# Patient Record
Sex: Female | Born: 1965 | Race: Black or African American | Hispanic: No | Marital: Married | Smoking: Never smoker
Health system: Southern US, Community
[De-identification: ages and names within clinical notes are randomized; demographics above are authoritative.]

---

## 1998-09-21 ENCOUNTER — Other Ambulatory Visit: Admission: RE | Admit: 1998-09-21 | Discharge: 1998-09-21 | Payer: Self-pay | Admitting: Obstetrics and Gynecology

## 1998-12-03 ENCOUNTER — Encounter: Payer: Self-pay | Admitting: Obstetrics and Gynecology

## 1998-12-03 ENCOUNTER — Ambulatory Visit (HOSPITAL_COMMUNITY): Admission: RE | Admit: 1998-12-03 | Discharge: 1998-12-03 | Payer: Self-pay | Admitting: Obstetrics and Gynecology

## 1999-03-24 ENCOUNTER — Encounter (HOSPITAL_COMMUNITY): Admission: RE | Admit: 1999-03-24 | Discharge: 1999-04-11 | Payer: Self-pay | Admitting: Obstetrics & Gynecology

## 1999-04-10 ENCOUNTER — Inpatient Hospital Stay (HOSPITAL_COMMUNITY): Admission: AD | Admit: 1999-04-10 | Discharge: 1999-04-12 | Payer: Self-pay | Admitting: *Deleted

## 2000-11-15 ENCOUNTER — Other Ambulatory Visit: Admission: RE | Admit: 2000-11-15 | Discharge: 2000-11-15 | Payer: Self-pay | Admitting: Obstetrics & Gynecology

## 2001-03-27 ENCOUNTER — Encounter: Admission: RE | Admit: 2001-03-27 | Discharge: 2001-03-27 | Payer: Self-pay | Admitting: Obstetrics & Gynecology

## 2001-03-27 ENCOUNTER — Encounter: Payer: Self-pay | Admitting: Obstetrics & Gynecology

## 2001-04-06 ENCOUNTER — Inpatient Hospital Stay (HOSPITAL_COMMUNITY): Admission: AD | Admit: 2001-04-06 | Discharge: 2001-04-08 | Payer: Self-pay | Admitting: Obstetrics

## 2003-07-28 ENCOUNTER — Encounter: Admission: RE | Admit: 2003-07-28 | Discharge: 2003-07-28 | Payer: Self-pay | Admitting: Obstetrics and Gynecology

## 2004-08-25 ENCOUNTER — Ambulatory Visit (HOSPITAL_COMMUNITY): Admission: RE | Admit: 2004-08-25 | Discharge: 2004-08-25 | Payer: Self-pay | Admitting: Obstetrics and Gynecology

## 2004-12-19 ENCOUNTER — Inpatient Hospital Stay (HOSPITAL_COMMUNITY): Admission: AD | Admit: 2004-12-19 | Discharge: 2004-12-19 | Payer: Self-pay | Admitting: Obstetrics & Gynecology

## 2004-12-19 ENCOUNTER — Ambulatory Visit: Payer: Self-pay | Admitting: Family Medicine

## 2004-12-23 ENCOUNTER — Inpatient Hospital Stay (HOSPITAL_COMMUNITY): Admission: AD | Admit: 2004-12-23 | Discharge: 2004-12-24 | Payer: Self-pay | Admitting: Obstetrics & Gynecology

## 2004-12-25 ENCOUNTER — Inpatient Hospital Stay (HOSPITAL_COMMUNITY): Admission: AD | Admit: 2004-12-25 | Discharge: 2004-12-27 | Payer: Self-pay | Admitting: Obstetrics and Gynecology

## 2006-08-04 IMAGING — US US FETAL BPP W/O NONSTRESS
1 series · 12 of 12 positions shown · non-contrast
Comparison: none

CLINICAL DATA: Patient is 40 weeks by office ultrasound.

[Series 1: us fetal bpp w/o nonstress · 0.35mm/px · 12 of 12 slices shown]
[im 1/12]
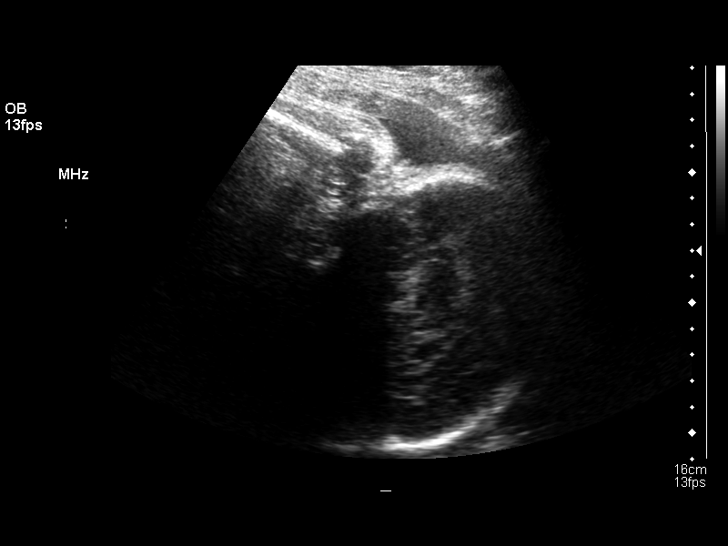
[im 2/12]
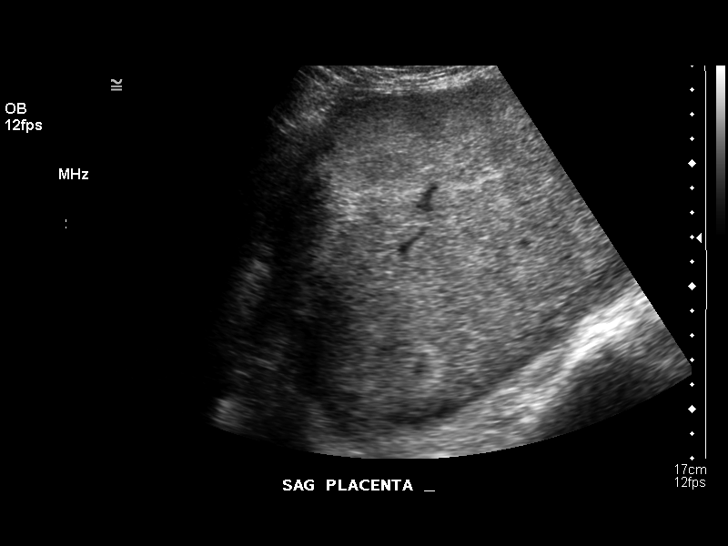
[im 3/12]
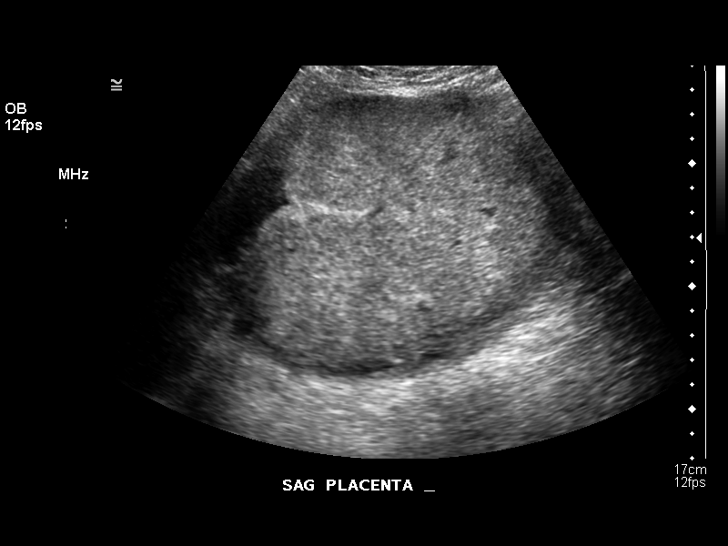
[im 4/12]
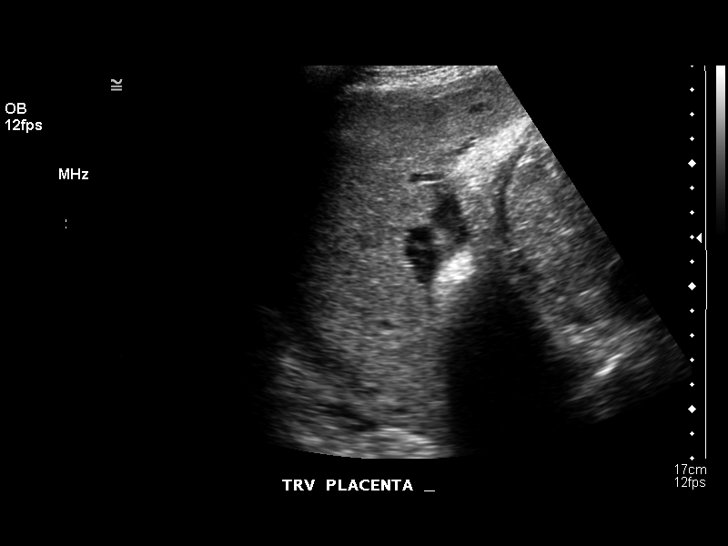
[im 5/12]
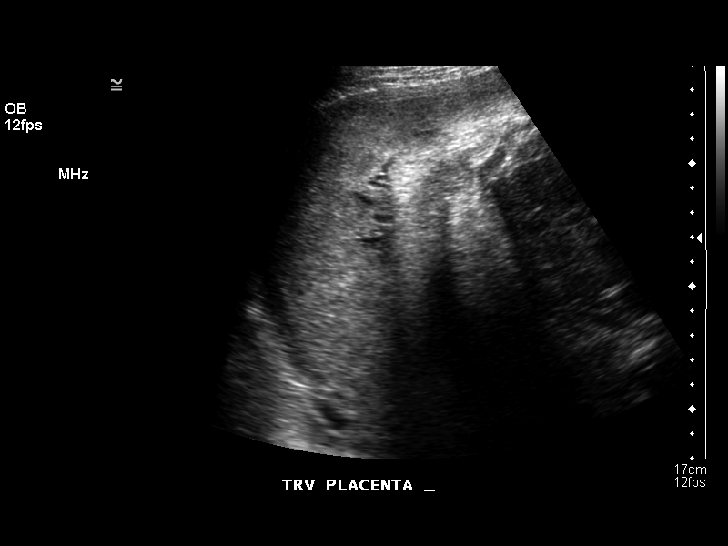
[im 6/12]
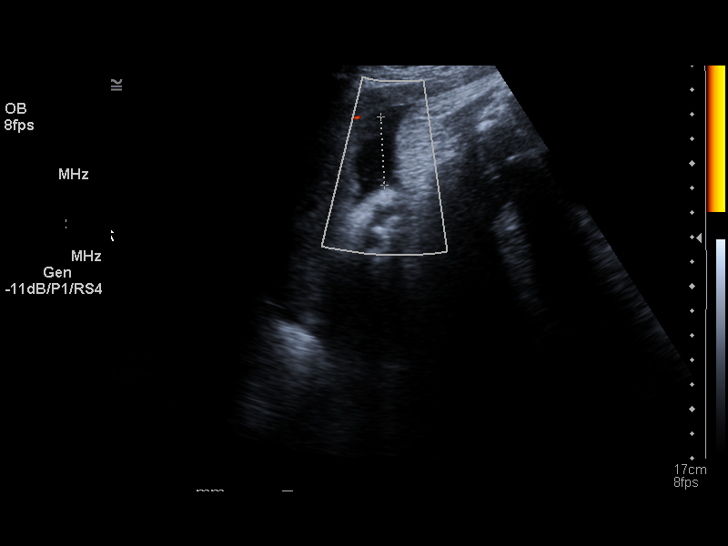
[im 7/12]
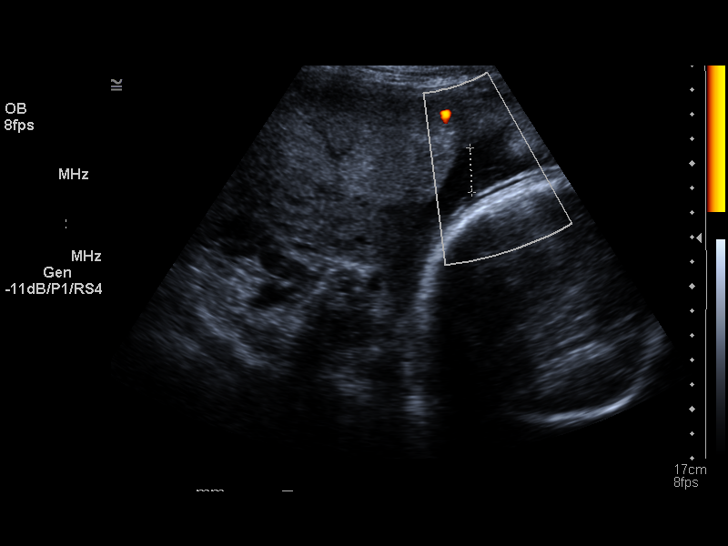
[im 8/12]
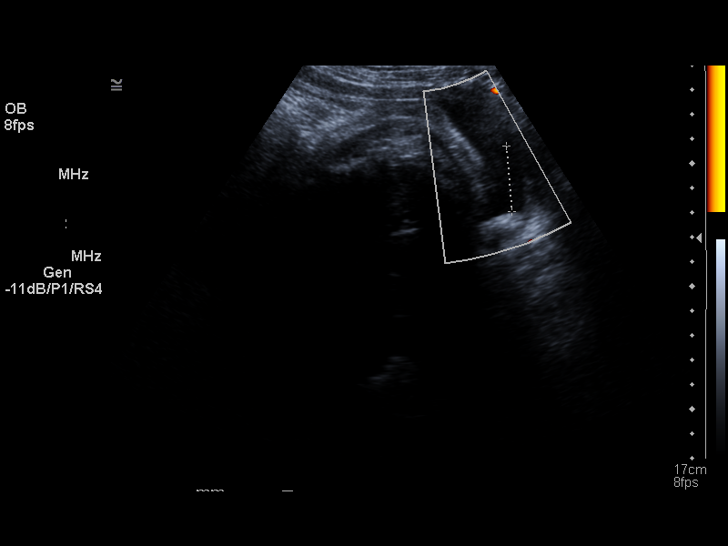
[im 9/12]
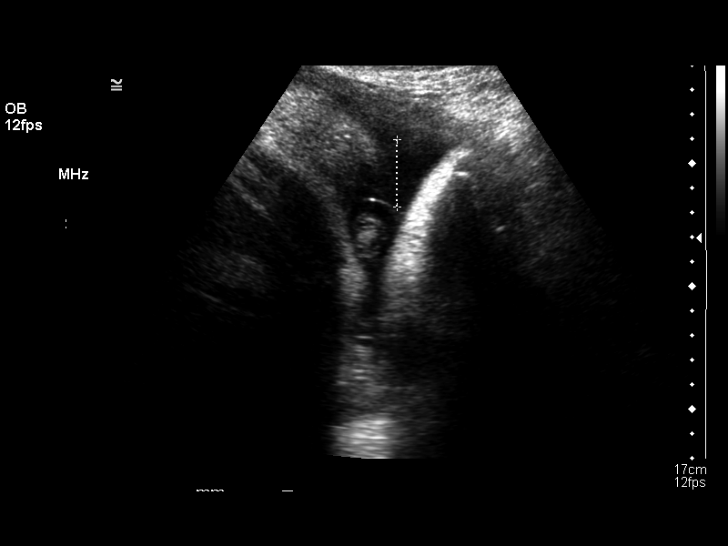
[im 10/12]
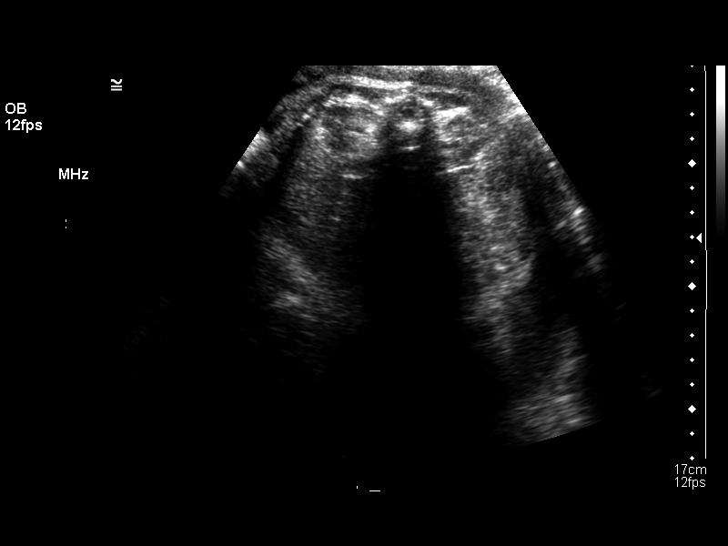
[im 11/12]
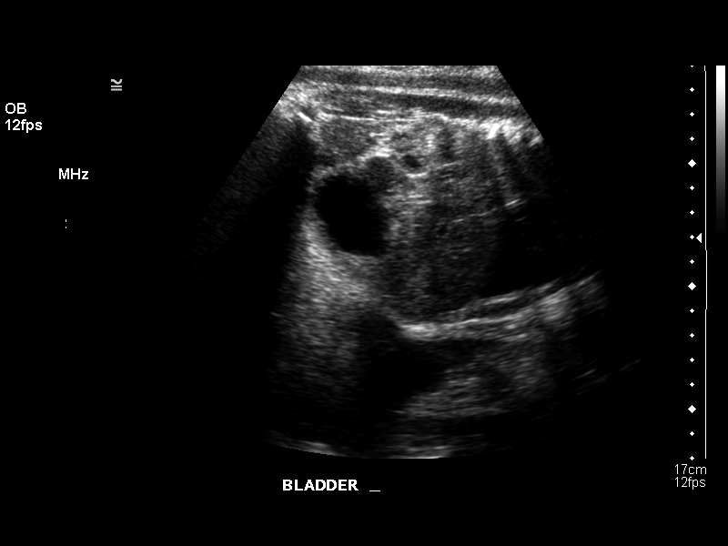
[im 12/12]
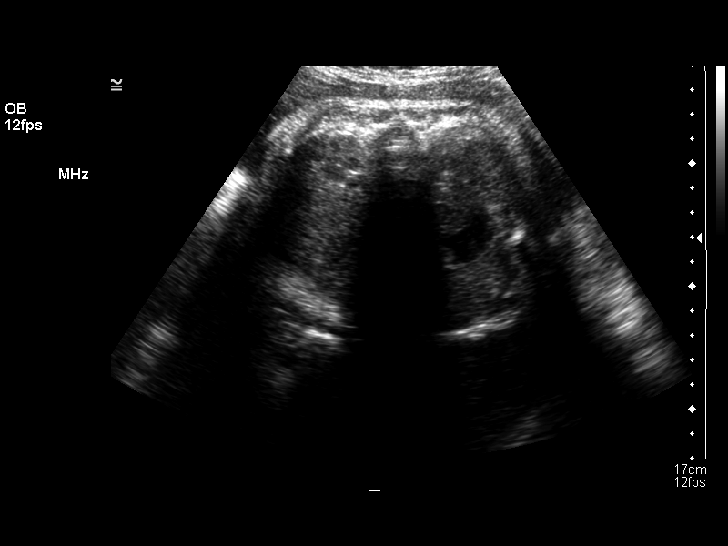

[12 of 12 positions shown; findings below may reference images not displayed]

BIOPHYSICAL PROFILE

Number of Fetuses:  Single
Heart rate:  143 beats per minute 
Movement:  Yes
Breathing:  Yes
Presentation:  Cephalic
Placental Location:  Anterior, right lateral
Grade:  II
Previa:  No
Amniotic Fluid (Subjective):  Normal
Amniotic Fluid (Objective):  AFI 10.0 cm (1th-31th %ile = 7.0-19.4 cm for 41 weeks) 

Fetal measurements and complete anatomic evaluation were not requested.  The following fetal anatomy was visualized on this exam:

BPP SCORING
Movements:  2
Breathing:  2
Tone:  2
Amniotic Fluid:  2
Total Score:  [DATE]

MATERNAL UTERINE AND ADNEXAL FINDINGS
Cervix:  Not evaluated
IMPRESSION: 1,  Single live intrauterine gestation with fetal cardiac motion at 143 beats per minute.   Biophysical profile remains [DATE] during 15 minutes of imaging.  

2.  Amniotic fluid volume remains subjectively normal with an AFI of 10.0 cm.

## 2007-01-07 ENCOUNTER — Ambulatory Visit (HOSPITAL_COMMUNITY): Admission: RE | Admit: 2007-01-07 | Discharge: 2007-01-07 | Payer: Self-pay | Admitting: Obstetrics & Gynecology

## 2007-04-24 ENCOUNTER — Ambulatory Visit: Payer: Self-pay | Admitting: Obstetrics & Gynecology

## 2007-04-24 ENCOUNTER — Inpatient Hospital Stay (HOSPITAL_COMMUNITY): Admission: AD | Admit: 2007-04-24 | Discharge: 2007-04-25 | Payer: Self-pay | Admitting: Family Medicine

## 2008-08-17 IMAGING — US US OB DETAIL+14 WK
1 series · 14 of 18 positions shown · non-contrast
Comparison: none

OBSTETRICAL ULTRASOUND:

 This ultrasound exam was performed in the [HOSPITAL] Ultrasound Department.  The OB US report was generated in the AS system, and faxed to the ordering physician.  This report is also available in [REDACTED] PACS.

[Series 1: us ob detail+14 wk · 0.24mm/px · 14 of 18 slices shown]
[im 1/18]
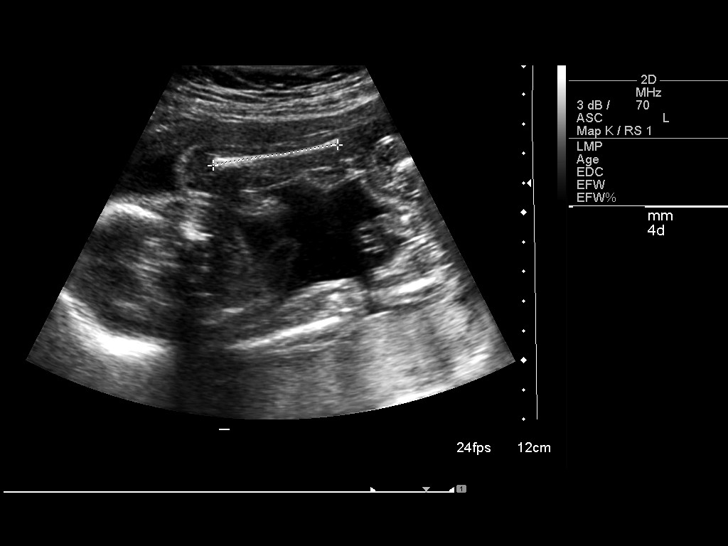
[im 2/18]
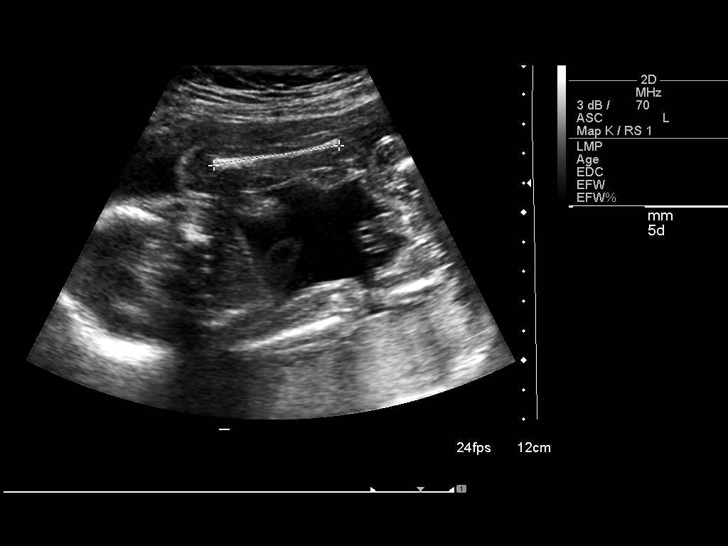
[im 4/18]
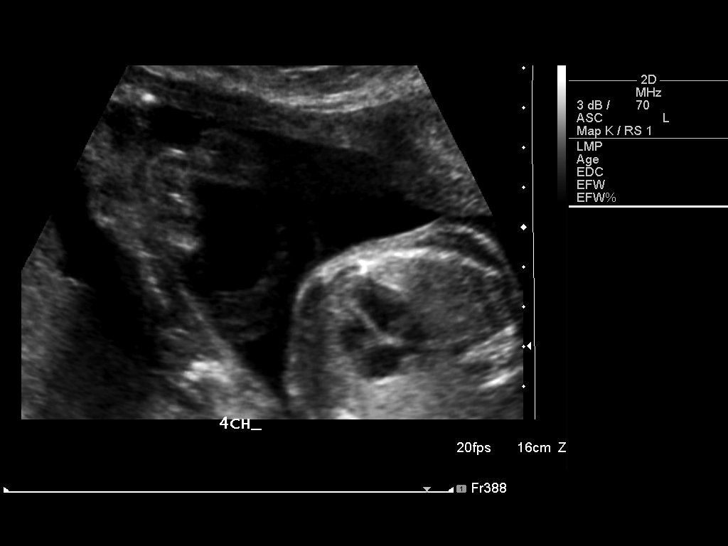
[im 5/18]
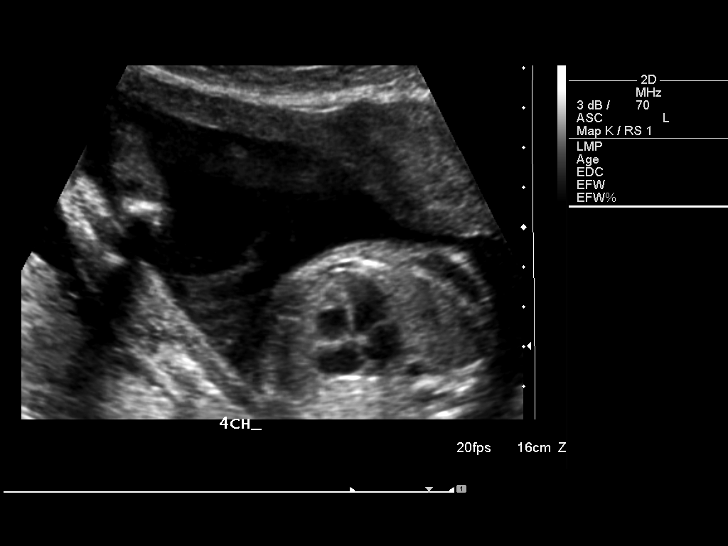
[im 6/18]
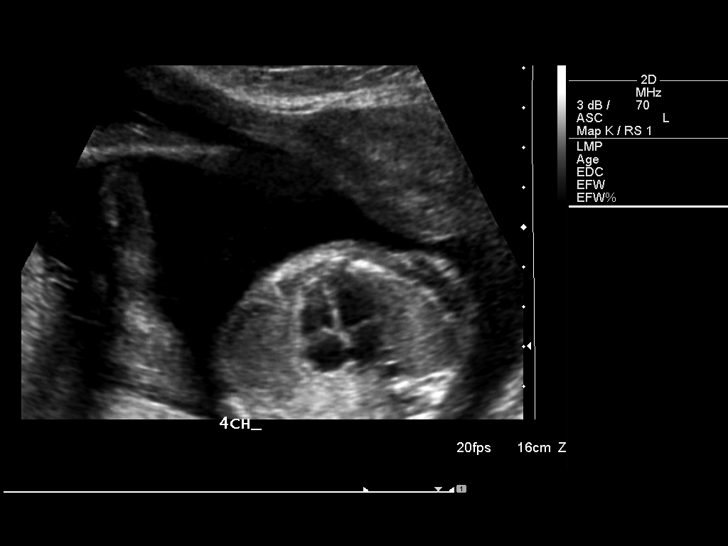
[im 8/18]
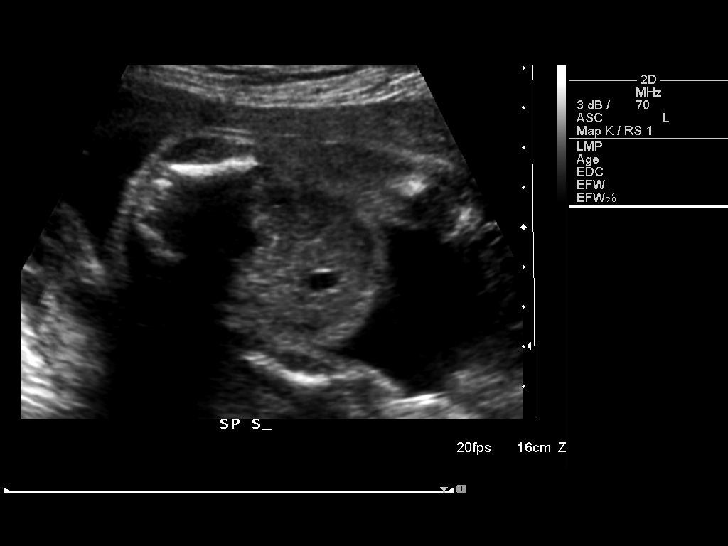
[im 9/18]
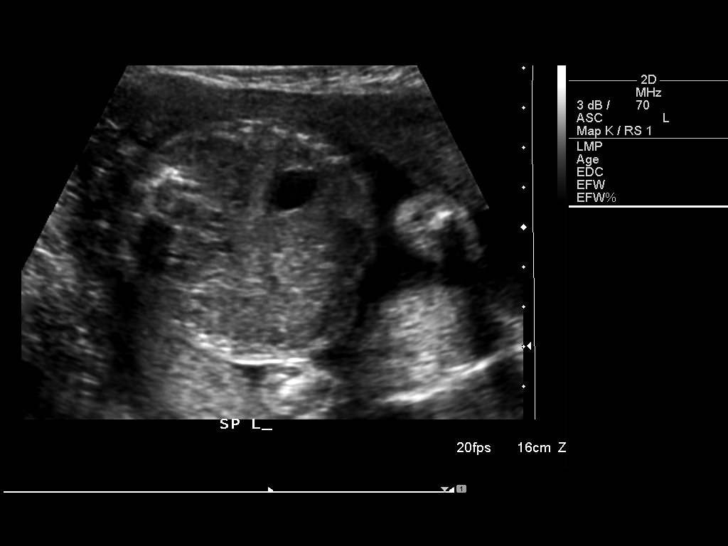
[im 10/18]
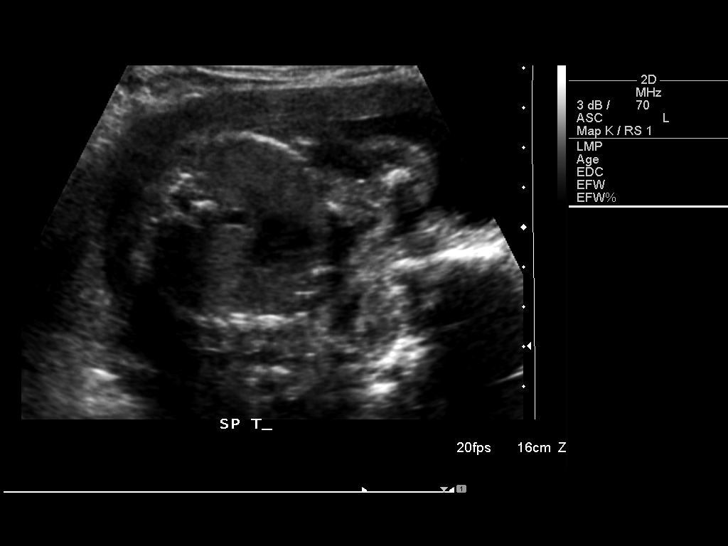
[im 11/18]
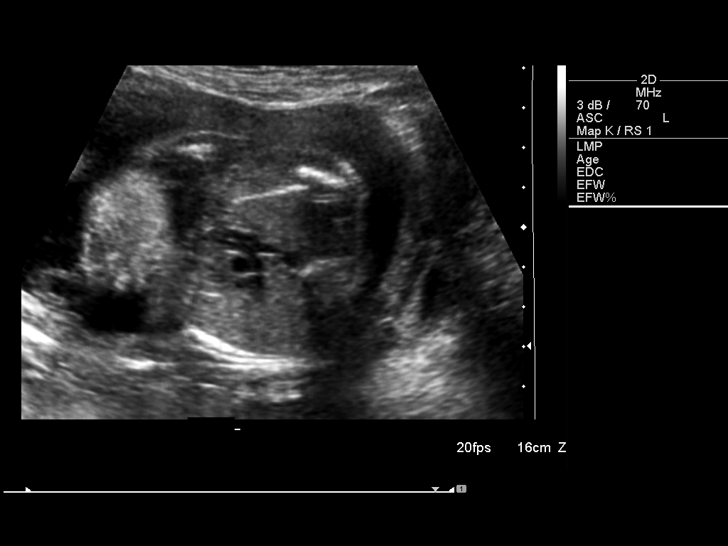
[im 13/18]
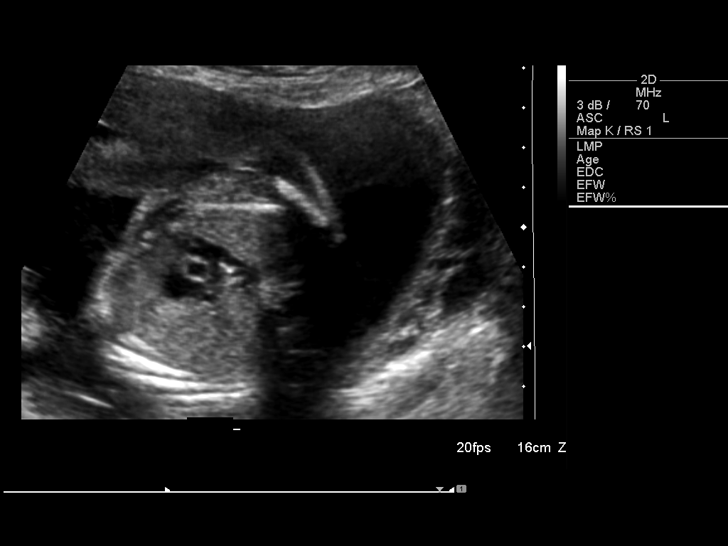
[im 14/18]
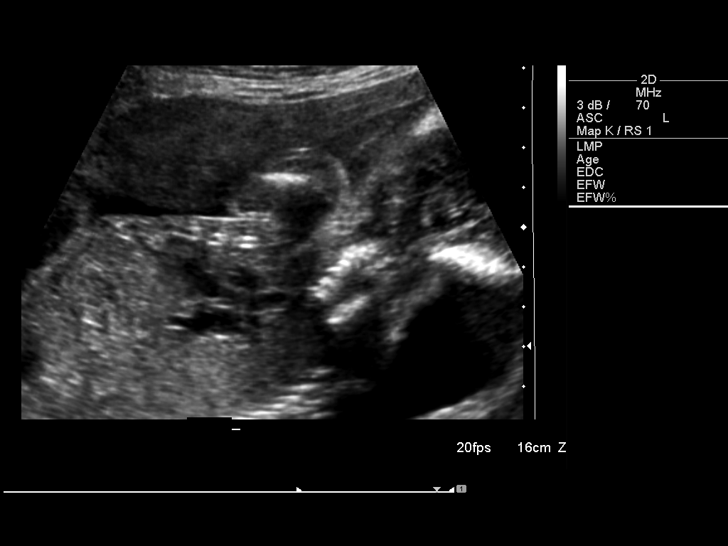
[im 15/18]
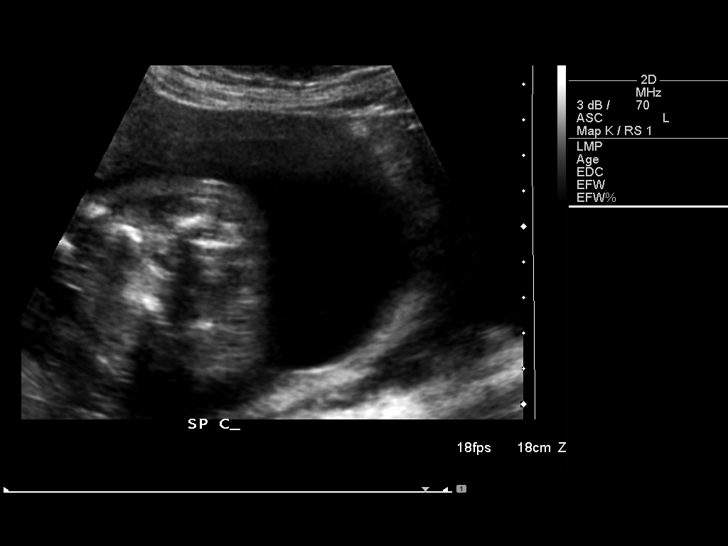
[im 17/18]
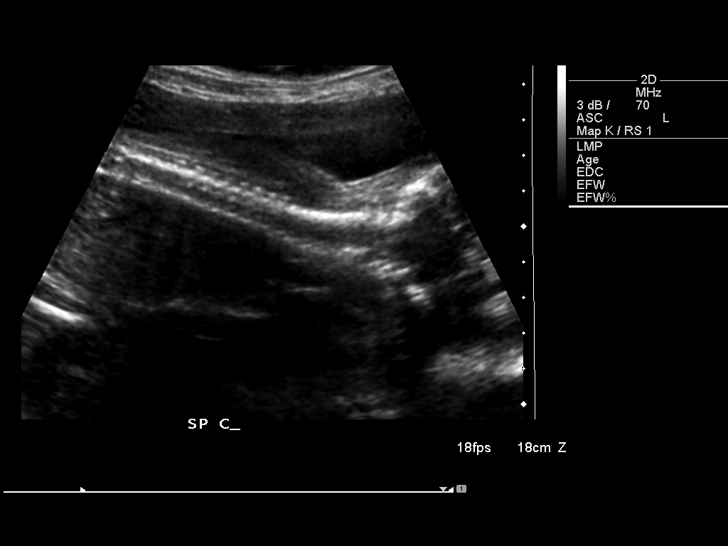
[im 18/18]
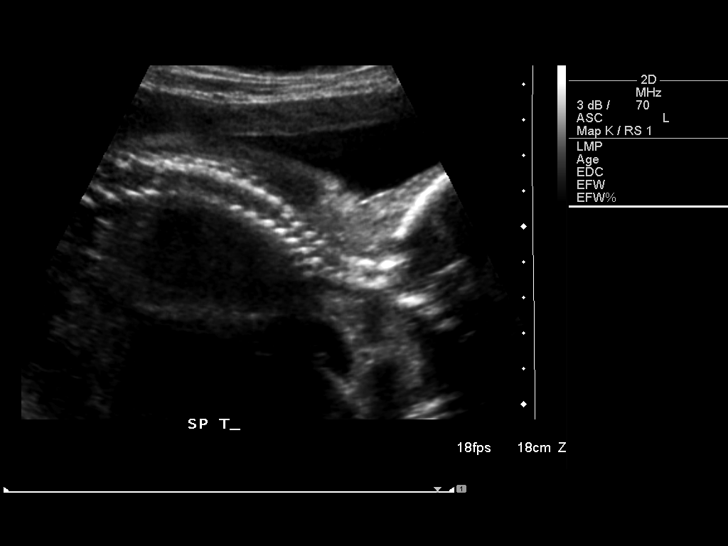

[14 of 18 positions shown; findings below may reference images not displayed]

IMPRESSION: See AS Obstetric US report.

## 2011-06-19 ENCOUNTER — Other Ambulatory Visit: Payer: Self-pay | Admitting: Internal Medicine

## 2011-06-19 ENCOUNTER — Ambulatory Visit
Admission: RE | Admit: 2011-06-19 | Discharge: 2011-06-19 | Disposition: A | Discharge status: Home / Self Care | Payer: No Typology Code available for payment source | Source: Ambulatory Visit | Attending: Internal Medicine | Admitting: Internal Medicine

## 2011-06-19 DIAGNOSIS — R7611 Nonspecific reaction to tuberculin skin test without active tuberculosis: Secondary | ICD-10-CM

## 2011-07-10 LAB — CBC
HCT: 34.5 — ABNORMAL LOW
Hemoglobin: 11.8 — ABNORMAL LOW
MCV: 86.9
Platelets: 270
RDW: 13.9

## 2013-01-27 IMAGING — CR DG CHEST 2V
2 series · 2 of 2 positions shown · non-contrast
Comparison: None.

CLINICAL DATA: Positive PPD.

CHEST - 2 VIEW

[w chest pa]
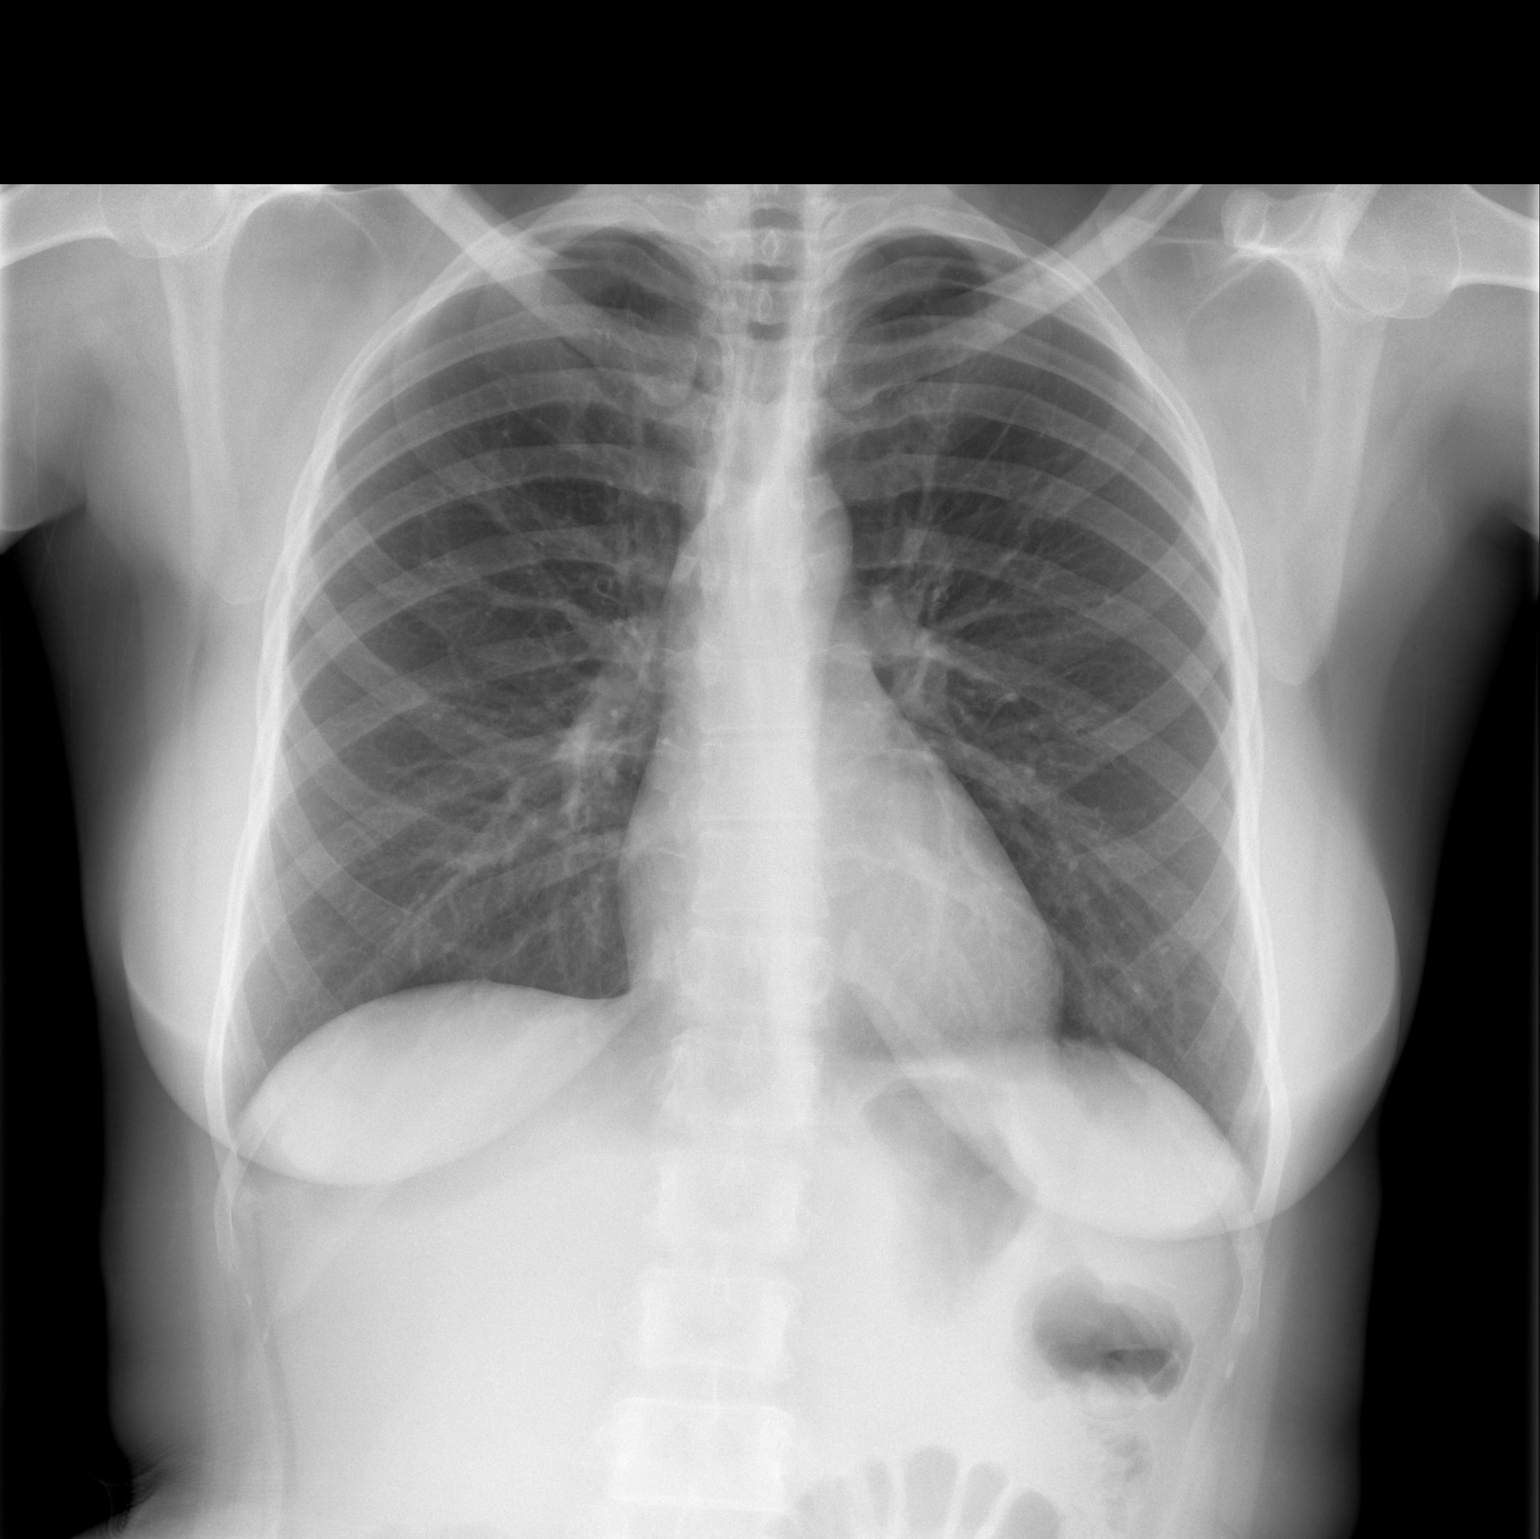

[w chest lat]
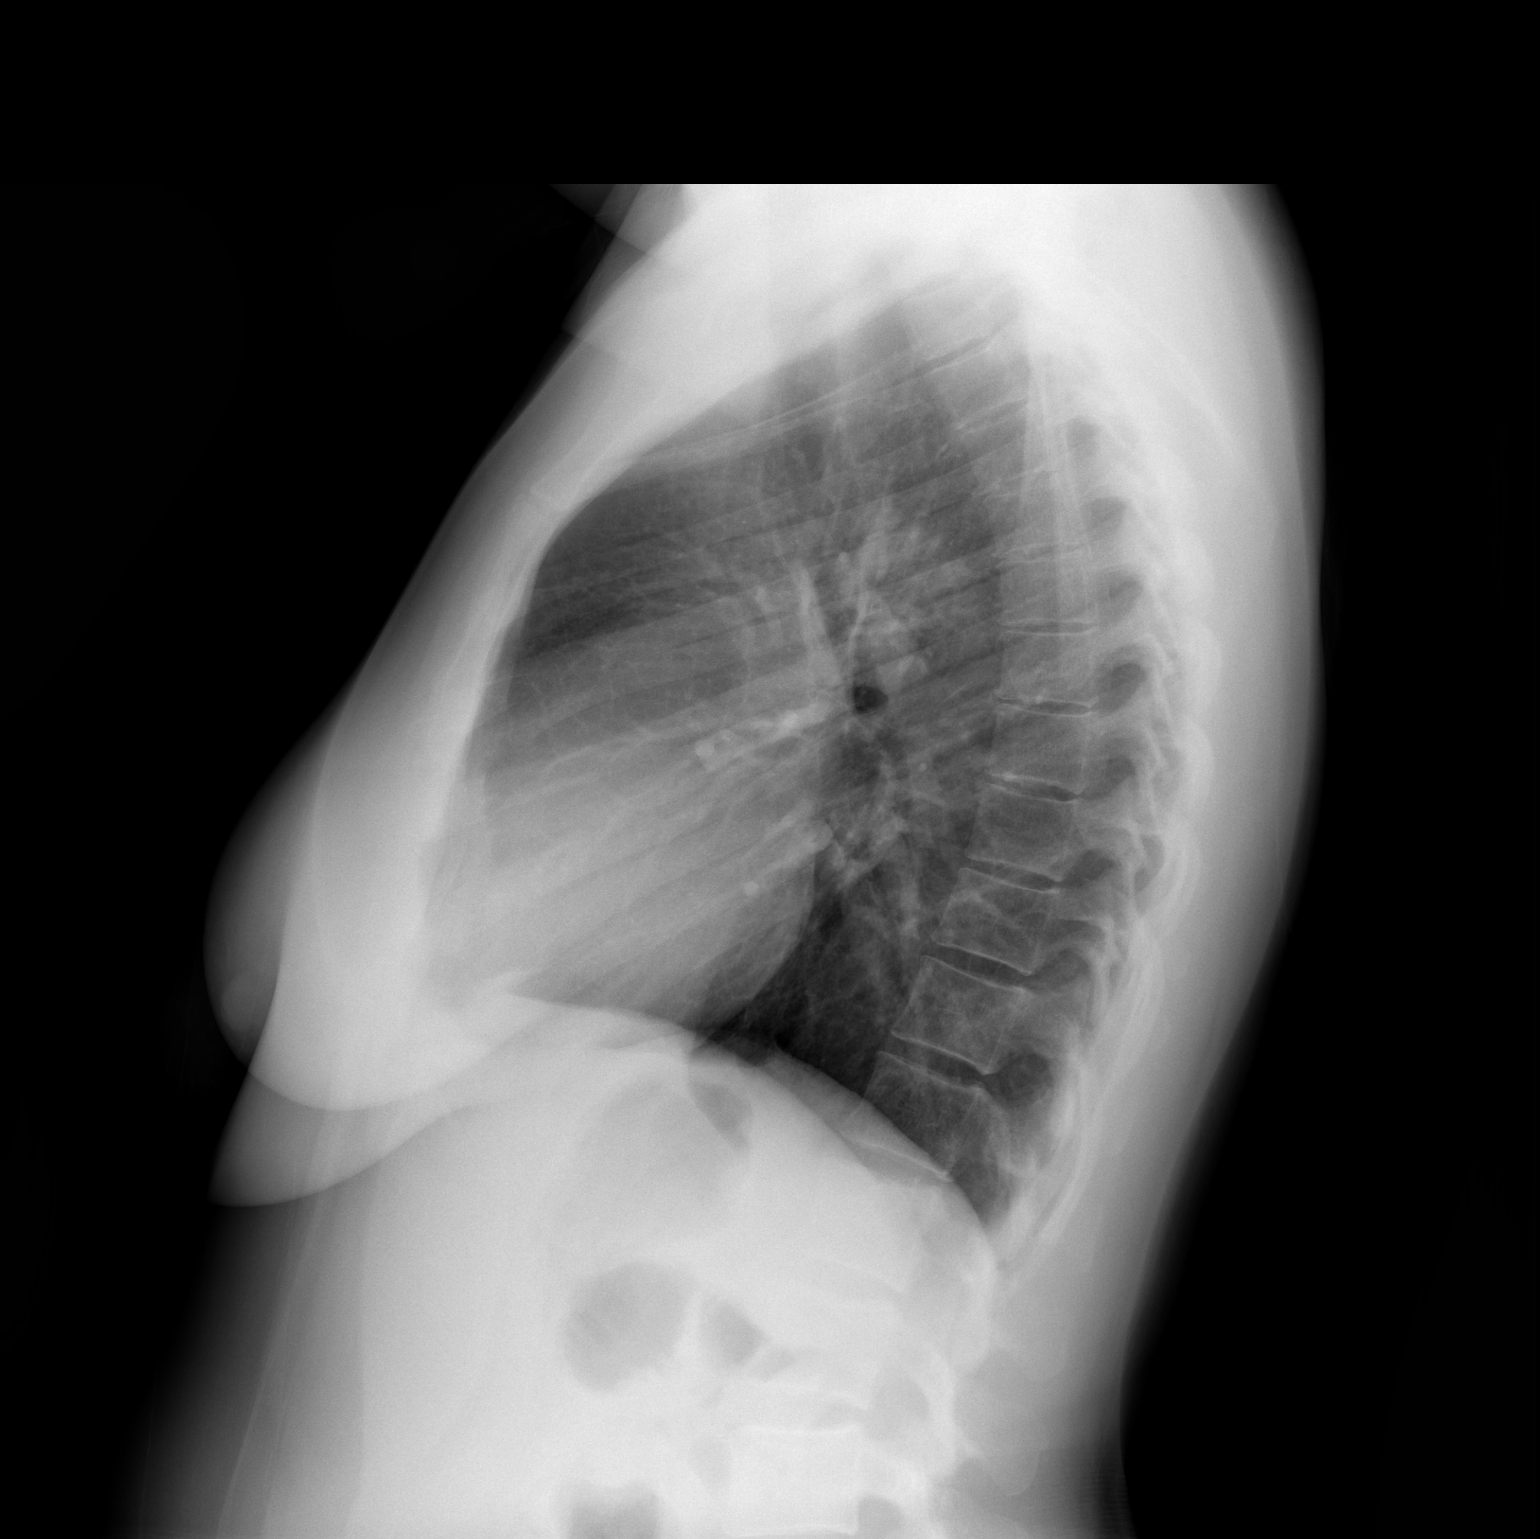

[2 of 2 positions shown; findings below may reference images not displayed]

FINDINGS: The lungs are clear without focal consolidation, edema,
effusion or pneumothorax.  Cardiopericardial silhouette is within
normal limits for size.  Imaged bony structures of the thorax are
intact.
IMPRESSION: Normal exam.

## 2016-08-09 ENCOUNTER — Encounter: Payer: Self-pay | Admitting: Family Medicine

## 2016-08-28 ENCOUNTER — Ambulatory Visit: Payer: Self-pay | Admitting: Family Medicine

## 2016-09-29 ENCOUNTER — Ambulatory Visit (INDEPENDENT_AMBULATORY_CARE_PROVIDER_SITE_OTHER): Payer: BLUE CROSS/BLUE SHIELD | Admitting: Family Medicine

## 2016-09-29 ENCOUNTER — Encounter: Payer: Self-pay | Admitting: Family Medicine

## 2016-09-29 VITALS — BP 120/80 | HR 76 | Temp 98.9°F | Ht 65.4 in | Wt 220.0 lb

## 2016-09-29 DIAGNOSIS — M722 Plantar fascial fibromatosis: Secondary | ICD-10-CM | POA: Diagnosis not present

## 2016-09-29 NOTE — Patient Instructions (Addendum)
It was good to meet you today!  We will get records from your previous primary care and I will see you back as needed or when your pap smear is due.   Take care and seek immediate care sooner if you develop any concerns.   Dr. Leland HerElsia J Dora Clauss, DO [facility]     Plantar Fasciitis Plantar fasciitis is a painful foot condition that affects the heel. It occurs when the band of tissue that connects the toes to the heel bone (plantar fascia) becomes irritated. This can happen after exercising too much or doing other repetitive activities (overuse injury). The pain from plantar fasciitis can range from mild irritation to severe pain that makes it difficult for you to walk or move. The pain is usually worse in the morning or after you have been sitting or lying down for a while. CAUSES This condition may be caused by:  Standing for long periods of time.  Wearing shoes that do not fit.  Doing high-impact activities, including running, aerobics, and ballet.  Being overweight.  Having an abnormal way of walking (gait).  Having tight calf muscles.  Having high arches in your feet.  Starting a new athletic activity. SYMPTOMS The main symptom of this condition is heel pain. Other symptoms include:  Pain that gets worse after activity or exercise.  Pain that is worse in the morning or after resting.  Pain that goes away after you walk for a few minutes. DIAGNOSIS This condition may be diagnosed based on your signs and symptoms. Your health care provider will also do a physical exam to check for:  A tender area on the bottom of your foot.  A high arch in your foot.  Pain when you move your foot.  Difficulty moving your foot. TREATMENT  Treatment for plantar fasciitis depends on the severity of the condition. Your treatment may include:  Rest, ice, and over-the-counter pain medicines to manage your pain.  Exercises to stretch your calves and your plantar  fascia. HOME CARE INSTRUCTIONS  Avoid activities that cause pain.  Roll the bottom of your foot over a bag of ice or a bottle of cold water. Do this for 20 minutes, 3-4 times a day.  Perform simple stretches as directed by your health care provider.  Try wearing athletic shoes with air-sole or gel-sole cushions or soft shoe inserts. PREVENTION   Do not perform exercises or activities that cause heel pain.  Consider finding low-impact activities if you continue to have problems.  Lose weight if you need to. The best way to prevent plantar fasciitis is to avoid the activities that aggravate your plantar fascia. SEEK MEDICAL CARE IF:  Your symptoms do not go away after treatment with home care measures.  Your pain gets worse.  Your pain affects your ability to move or do your daily activities. This information is not intended to replace advice given to you by your health care provider. Make sure you discuss any questions you have with your health care provider. Document Released: 06/06/2001 Document Revised: 01/03/2016 Document Reviewed: 07/22/2014 Elsevier Interactive Patient Education  2017 [employer]

## 2016-09-29 NOTE — Assessment & Plan Note (Signed)
Foot pain likely from plantar fasciitis, seems mild. Advised patient to try stretching and OTC ibuprofen as needed.

## 2016-09-29 NOTE — Progress Notes (Signed)
Subjective:  Karina Gardner is a 51 y.o. female who [facility] today to establish as new patient.  HPI: [facility], states wanted to switch based on recommendations from her friends.  #Back pain/R shoulder pain Gets occasional back pain and R shoulder pain after carrying a heavy purse on that side. Does not currently have any pain today. Has tried OTC ibuprofen and massage with relief when present. No numbness, weakness, tingling.  #Tooth pain Has tooth pain of her R upper molar area. Has not sought out a dentist yet. No fever/chills, n/v. Does not interfere with her ability to eat or drink.  #Foot pain Has occasional tightness on the bottoms of her feet, worse at the heel, present in the mornings. 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 Has noticed improvement with stretching and massage. Does not currently have foot pain.  #Health maintenance Last pap was approximately 1 year ago Declines flu shot States has had TDAP within the last 10 years.   ROS: Per HPI, otherwise all systems reviewed and are negative  PMH:  The following were reviewed and entered/updated in epic: History reviewed. No pertinent past medical history. There are no active problems to display for this patient.  History reviewed. No pertinent surgical history.  Family History  Problem Relation Age of Onset  . Asthma Father   . Environmental Allergies Father   . Asthma Son     Medications- reviewed and updated No current outpatient prescriptions on file.   No current facility-administered medications for this visit.     Allergies-reviewed and updated No Known Allergies  Social History   Social History  . Marital status: Married    Spouse name: N/A  . Number of children: N/A  . Years of education: N/A   Social  History Main Topics  . Smoking status: Never Smoker  . Smokeless tobacco: Never Used  . Alcohol use No  . Drug use: No  . Sexual activity: Yes    Partners: Male    Birth control/ protection: None   Other Topics Concern  . None   Social History Narrative   Lives with husband and 4 kids (ages 8-16)    Objective:  Physical Exam: BP 120/80   Pulse 76   Temp 98.9 F (37.2 C) (Oral)   Ht 5' 5.4" (1.661 m)   Wt 220 lb (99.8 kg)   LMP 09/22/2016   SpO2 98%   BMI 36.16 kg/m   Gen: NAD, resting comfortably HEENT: [place], AT. EOMI, conjunctiva clear. TMs clear b/l. MMM, oropharynx unremarkable. R upper molar with visible dark caries but remainder of teeth with good dentition. CV: RRR with no murmurs appreciated Pulm: NWOB, CTAB with no crackles, wheezes, or rhonchi GI: Normal bowel sounds present. Soft, Nontender, Nondistended. MSK: no edema, cyanosis, or clubbing noted. Feet are not tender  Skin: warm, dry Neuro: grossly normal, moves all extremities Psych: Normal affect and thought content   Assessment/Plan:  Plantar fasciitis Foot pain likely from plantar fasciitis, seems mild. Advised patient to try stretching and OTC ibuprofen as needed.  Health Maintenance  Declined flu shot Get records for last pap smear Patient will find a dentist for her cavity.   Will see as needed or for her next annual wellness exam.  Leland HerElsia J Yoo, DO PGY-1, [facility] 09/29/2016 3:17 PM

## 2016-10-03 ENCOUNTER — Ambulatory Visit: Payer: Self-pay | Admitting: Family Medicine

## 2016-10-03 ENCOUNTER — Encounter: Payer: BLUE CROSS/BLUE SHIELD | Admitting: Family Medicine

## 2017-04-20 ENCOUNTER — Encounter: Payer: Self-pay | Admitting: Family Medicine

## 2017-04-20 ENCOUNTER — Ambulatory Visit (INDEPENDENT_AMBULATORY_CARE_PROVIDER_SITE_OTHER): Payer: BLUE CROSS/BLUE SHIELD | Admitting: Family Medicine

## 2017-04-20 VITALS — BP 108/76 | HR 91 | Temp 99.0°F | Wt 215.0 lb

## 2017-04-20 DIAGNOSIS — Z114 Encounter for screening for human immunodeficiency virus [HIV]: Secondary | ICD-10-CM

## 2017-04-20 DIAGNOSIS — Z87898 Personal history of other specified conditions: Secondary | ICD-10-CM

## 2017-04-20 DIAGNOSIS — E669 Obesity, unspecified: Secondary | ICD-10-CM

## 2017-04-20 DIAGNOSIS — Z6835 Body mass index (BMI) 35.0-35.9, adult: Secondary | ICD-10-CM

## 2017-04-20 DIAGNOSIS — Z8709 Personal history of other diseases of the respiratory system: Secondary | ICD-10-CM | POA: Diagnosis not present

## 2017-04-20 MED ORDER — CETIRIZINE HCL 10 MG PO TABS: 10.0000 mg | ORAL_TABLET | Freq: Every day | ORAL | 3 refills | Status: DC

## 2017-04-20 NOTE — Patient Instructions (Addendum)
It was good to see you today!  For your cough,  - Please try taking zyrtec to see if this helps - Please make and appointment for PFTs with pharmacy clinic at the front desk  We are checking some labs today. If results require attention, either myself or my nurse will get in touch with you. If everything is normal, you will get a letter in the mail or a message in My Chart. Please give us a call if you do not hear from us after 2 weeks.   Please check-out at the front desk before leaving the clinic. Make an appointment in  2 weeks to talk about your foot pain and back pain.  Please bring all of your medications with you to each visit. Sign up for My Chart to have easy access to your labs results, and communication with your primary care physician.  Feel free to call with any questions or concerns at any time, at [phone].   Take care,  Dr. Leland HerElsia J Yoo, DO [facility]

## 2017-04-20 NOTE — Progress Notes (Signed)
    Subjective:  Karina Gardner is a 51 y.o. female who [facility]  HPI:  Chronic cough - Intermittent, will have for months and then no issues for another 5-6 months - Has been more of an issue over the past few weeks - Noticed that worse with exposure to chemicals such as bleach or cold air.  - Feels like has mucus on her throat associated with some congestion that occurs sometimes that may be due to allergies but no association with this cough.  - Sometimes has some SOB with wheeze, has tried using her son's albuterol inhaler which does help - Has family history, her father has asthma   Health maintenance  - Would like labs for general check up - Exercise: has not been able to exercise much because of issues with feet (as per discussion last visit on 09/29/16) thought does say she has had some improvement after switching to more comfortable shoes. - [facility]   ROS: Per HPI  Objective:  Physical Exam: BP 108/76   Pulse 91   Temp 99 F (37.2 C) (Oral)   Wt 215 lb (97.5 kg)   LMP 02/23/2017   SpO2 97%   BMI 35.34 kg/m   Gen: NAD, resting comfortably CV: RRR with no murmurs appreciated Pulm: NWOB, CTAB with no crackles, wheezes, or rhonchi GI: Normal bowel sounds present. Soft, Nontender, Nondistended. MSK: no edema, cyanosis, or clubbing noted Skin: warm, dry Neuro: grossly normal, moves all extremities Psych: Normal affect and thought content   Assessment/Plan:  Class 2 obesity without serious comorbidity with body mass index (BMI) of 35.0 to 35.9 in adult Discussed healthy lifestyle modifications. Exercise seems to be limited by MSK pain of feet and back/R shoulder pain that was discussed at last visit on 09/29/16 but that patient would like to talk further about at next visit. - Get baseline labs today including lipid panel  History of chronic cough Seems to have some  characteristics of reactive airways i.e. exacerbated by certain triggers and resolved with avoidance of those triggers. Regardless seems episodic in nature. No signs of asthma on physical exam today. - Trial zyrtec to see if has seasonal allergies - Given patient's self reported improvement with use of albuterol inhaler, will obtain PFTs  Health maintenance - ROI signed today to obtain [facility] - routine HIV drawn with baseline labs today  Follow up in 2 weeks to discuss foot pain and back/R shoulder pain  Leland HerElsia J Alvena Kiernan, DO PGY-2, [facility] 04/20/2017 4:30 PM

## 2017-04-21 LAB — CBC
Hematocrit: 36.3 % (ref 34.0–46.6)
Hemoglobin: 12.3 g/dL (ref 11.1–15.9)
MCH: 27 pg (ref 26.6–33.0)
MCHC: 33.9 g/dL (ref 31.5–35.7)
MCV: 80 fL (ref 79–97)
PLATELETS: 314 10*3/uL (ref 150–379)
RBC: 4.55 x10E6/uL (ref 3.77–5.28)
RDW: 14.3 % (ref 12.3–15.4)
WBC: 6.4 10*3/uL (ref 3.4–10.8)

## 2017-04-21 LAB — LIPID PANEL
CHOLESTEROL TOTAL: 158 mg/dL (ref 100–199)
Chol/HDL Ratio: 2.8 ratio (ref 0.0–4.4)
HDL: 57 mg/dL (ref >39)
LDL Calculated: 73 mg/dL (ref 0–99)
TRIGLYCERIDES: 141 mg/dL (ref 0–149)
VLDL Cholesterol Cal: 28 mg/dL (ref 5–40)

## 2017-04-21 LAB — BASIC METABOLIC PANEL
BUN/Creatinine Ratio: 9 (ref 9–23)
BUN: 9 mg/dL (ref 6–24)
CALCIUM: 9.6 mg/dL (ref 8.7–10.2)
CHLORIDE: 102 mmol/L (ref 96–106)
CO2: 27 mmol/L (ref 20–29)
Creatinine, Ser: 1.03 mg/dL — ABNORMAL HIGH (ref 0.57–1.00)
GFR calc non Af Amer: 64 mL/min/{1.73_m2} (ref >59)
GFR, EST AFRICAN AMERICAN: 74 mL/min/{1.73_m2} (ref >59)
GLUCOSE: 102 mg/dL — AB (ref 65–99)
POTASSIUM: 4.4 mmol/L (ref 3.5–5.2)
Sodium: 144 mmol/L (ref 134–144)

## 2017-04-21 LAB — HIV ANTIBODY (ROUTINE TESTING W REFLEX): HIV SCREEN 4TH GENERATION: NONREACTIVE

## 2017-04-22 DIAGNOSIS — Z8709 Personal history of other diseases of the respiratory system: Secondary | ICD-10-CM | POA: Insufficient documentation

## 2017-04-22 DIAGNOSIS — Z87898 Personal history of other specified conditions: Secondary | ICD-10-CM | POA: Insufficient documentation

## 2017-04-22 DIAGNOSIS — Z6835 Body mass index (BMI) 35.0-35.9, adult: Secondary | ICD-10-CM

## 2017-04-22 DIAGNOSIS — E669 Obesity, unspecified: Secondary | ICD-10-CM | POA: Insufficient documentation

## 2017-04-22 DIAGNOSIS — E66812 Obesity, class 2: Secondary | ICD-10-CM | POA: Insufficient documentation

## 2017-04-22 NOTE — Assessment & Plan Note (Signed)
Seems to have some characteristics of reactive airways i.e. exacerbated by certain triggers and resolved with avoidance of those triggers. Regardless seems episodic in nature. No signs of asthma on physical exam today. - Trial zyrtec to see if has seasonal allergies - Given patient's self reported improvement with use of albuterol inhaler, will obtain PFTs

## 2017-04-22 NOTE — Assessment & Plan Note (Signed)
Discussed healthy lifestyle modifications. Exercise seems to be limited by MSK pain of feet and back/R shoulder pain that was discussed at last visit on 09/29/16 but that patient would like to talk further about at next visit. - Get baseline labs today including lipid panel

## 2017-04-24 ENCOUNTER — Encounter: Payer: Self-pay | Admitting: Family Medicine

## 2017-05-07 ENCOUNTER — Ambulatory Visit (INDEPENDENT_AMBULATORY_CARE_PROVIDER_SITE_OTHER): Payer: BLUE CROSS/BLUE SHIELD | Admitting: Family Medicine

## 2017-05-07 ENCOUNTER — Encounter: Payer: Self-pay | Admitting: Family Medicine

## 2017-05-07 VITALS — BP 110/75 | HR 89 | Temp 98.5°F | Ht 65.0 in | Wt 210.2 lb

## 2017-05-07 DIAGNOSIS — M79601 Pain in right arm: Secondary | ICD-10-CM

## 2017-05-07 DIAGNOSIS — R200 Anesthesia of skin: Secondary | ICD-10-CM

## 2017-05-07 MED ORDER — GABAPENTIN 100 MG PO CAPS: 100.0000 mg | ORAL_CAPSULE | Freq: Three times a day (TID) | ORAL | 0 refills | Status: DC

## 2017-05-07 MED ORDER — IBUPROFEN 600 MG PO TABS: 600.0000 mg | ORAL_TABLET | Freq: Four times a day (QID) | ORAL | 0 refills | Status: AC | PRN

## 2017-05-07 NOTE — Progress Notes (Signed)
    Subjective:  Karina Gardner is a 51 y.o. female who [facility] today with a chief complaint of R arm and leg pain.   HPI: R arm and leg pain - has had chronic R sided arm pain for years but over the last 1-2 month has been acutely worsening - Intermittent,  mild aching, happens 1-2times per week, lasts for 1-2 days, sometimes feels numbness - Worse with laying on R side, also will causes R leg to hurt. Overuse makes it worse  - Relieved with rest  - Has not tried any OTC medications - No falls, no weakness.   ROS: Per HPI  Objective:  Physical Exam: BP 110/75 (BP Location: Right Arm, Patient Position: Sitting, Cuff Size: Normal)   Pulse 89   Temp 98.5 F (36.9 C) (Oral)   Ht 5\' 5"  (1.651 m)   Wt 210 lb 3.2 oz (95.3 kg)   LMP  (LMP Unknown)   SpO2 99%   BMI 34.98 kg/m   Gen: NAD, resting comfortably CV: RRR with no murmurs appreciated Pulm: NWOB, CTAB with no crackles, wheezes, or rhonchi GI: Normal bowel sounds present. Soft, Nontender, Nondistended. MSK: no edema, cyanosis, or clubbing noted. Strength 5/5. Neg yergason, neg empty can test Skin: warm, dry Neuro: grossly normal, moves all extremities but decreased sensation to touch on lateral R leg greater on upper thigh than lower thigh. DTRs 2+ in patellar b/l.  Psych: Normal affect and thought content    Assessment/Plan:  Right leg numbness Uncertain etiology. Possible meralgia paresthetica however would be unusual to have lower leg involvement. Does not follow dermatomal distribution. Unlikely to be sciatica since having no hip or lumbar pain. Reassuring and strength and DTRs intact. Given quality of pain likely nerve involvement, will treat with gabapentin and send for EMG testing.   Leland HerElsia J Aleera Gilcrease, DO PGY-2, [facility] 05/07/2017 4:26 PM

## 2017-05-07 NOTE — Patient Instructions (Addendum)
It was good to see you today!  For your cough, - your cetirizine from last visit was sent electronically to your pharmacy, please go and pick it up - Make an appointment at our front desk with pharmacy clinic for PFTs  For your leg - Take gabapentin for your nerve pain - Referral for nerve testing has been placed, someone will call you, let us know if you have not heard back in 2 weeks.  Please check-out at the front desk before leaving the clinic. Make an appointment to see me after you get the lung testing and seeing the specialist   Please bring all of your medications with you to each visit.   Sign up for My Chart to have easy access to your labs results, and communication with your primary care physician.  Feel free to call with any questions or concerns at any time, at [phone].   Take care,  Dr. Leland HerElsia J Inman Fettig, DO [facility]

## 2017-05-08 ENCOUNTER — Other Ambulatory Visit: Payer: Self-pay | Admitting: *Deleted

## 2017-05-08 ENCOUNTER — Encounter: Payer: Self-pay | Admitting: Neurology

## 2017-05-08 DIAGNOSIS — M79601 Pain in right arm: Secondary | ICD-10-CM | POA: Insufficient documentation

## 2017-05-08 DIAGNOSIS — R200 Anesthesia of skin: Secondary | ICD-10-CM

## 2017-05-08 NOTE — Assessment & Plan Note (Signed)
Uncertain etiology. Possible meralgia paresthetica however would be unusual to have lower leg involvement. Does not follow dermatomal distribution. Unlikely to be sciatica since having no hip or lumbar pain. Reassuring and strength and DTRs intact. Given quality of pain likely nerve involvement, will treat with gabapentin and send for EMG testing.

## 2017-05-08 NOTE — Assessment & Plan Note (Signed)
Likely muscle strain from over use as patient is R handed and has been very active at work with lots of writing on chalkboard. Strength and sensation intact. No TTP. No concern for tenditinis or rotator cuff tear given neg special tests.  - Conservative management with NSAIDs and stretching/exercises.

## 2017-05-31 ENCOUNTER — Encounter: Payer: BLUE CROSS/BLUE SHIELD | Admitting: Neurology

## 2018-04-01 ENCOUNTER — Other Ambulatory Visit (HOSPITAL_COMMUNITY)
Admission: RE | Admit: 2018-04-01 | Discharge: 2018-04-01 | Disposition: A | Discharge status: Home / Self Care | Payer: BLUE CROSS/BLUE SHIELD | Source: Ambulatory Visit | Attending: Family Medicine | Admitting: Family Medicine

## 2018-04-01 ENCOUNTER — Other Ambulatory Visit: Payer: Self-pay

## 2018-04-01 ENCOUNTER — Encounter: Payer: Self-pay | Admitting: Family Medicine

## 2018-04-01 ENCOUNTER — Ambulatory Visit (INDEPENDENT_AMBULATORY_CARE_PROVIDER_SITE_OTHER): Payer: BLUE CROSS/BLUE SHIELD | Admitting: Family Medicine

## 2018-04-01 VITALS — BP 126/84 | HR 85 | Temp 98.7°F | Ht 64.5 in | Wt 213.0 lb

## 2018-04-01 DIAGNOSIS — E669 Obesity, unspecified: Secondary | ICD-10-CM | POA: Diagnosis not present

## 2018-04-01 DIAGNOSIS — Z1231 Encounter for screening mammogram for malignant neoplasm of breast: Secondary | ICD-10-CM

## 2018-04-01 DIAGNOSIS — Z1239 Encounter for other screening for malignant neoplasm of breast: Secondary | ICD-10-CM

## 2018-04-01 DIAGNOSIS — Z23 Encounter for immunization: Secondary | ICD-10-CM

## 2018-04-01 DIAGNOSIS — Z124 Encounter for screening for malignant neoplasm of cervix: Secondary | ICD-10-CM | POA: Diagnosis not present

## 2018-04-01 DIAGNOSIS — Z0000 Encounter for general adult medical examination without abnormal findings: Secondary | ICD-10-CM | POA: Diagnosis not present

## 2018-04-01 DIAGNOSIS — Z1211 Encounter for screening for malignant neoplasm of colon: Secondary | ICD-10-CM | POA: Diagnosis not present

## 2018-04-01 DIAGNOSIS — Z6835 Body mass index (BMI) 35.0-35.9, adult: Secondary | ICD-10-CM | POA: Diagnosis not present

## 2018-04-01 NOTE — Assessment & Plan Note (Signed)
Discussed healthy lifestyle with exercise. Check LDL direct with yearly screening bloodwork today.

## 2018-04-01 NOTE — Assessment & Plan Note (Signed)
-   Mammogram ordered for routine screening - Referral to GI for routine screening colonoscopy - Tdap given today  - pap collected today

## 2018-04-01 NOTE — Patient Instructions (Addendum)
It was good to see you today!  Please get your mammogram.  Referral to Gastroenterology placed for colonoscopy.  Please check-out at the front desk before leaving the clinic. Make an appointment in 1 year for annual wellness.  We are checking some labs today. If results require attention, either myself or my nurse will get in touch with you. If everything is normal, you will get a letter in the mail or a message in My Chart. Please give us a call if you do not hear from us after 2 weeks.   Please bring all of your medications with you to each visit.   Sign up for My Chart to have easy access to your labs results, and communication with your primary care physician.  Feel free to call with any questions or concerns at any time, at [phone].   Take care,  Dr. Leland HerElsia J Tasheba Henson, DO [facility]

## 2018-04-01 NOTE — Progress Notes (Addendum)
Subjective:  Karina Gardner is a 52 y.o. female who [facility]  for annual   HPI:  No complaints today. Has been doing well and overall in good health. Exercises about 1 time per month, plays basketball with her children   ROS: Per HPI, otherwise all systems reviewed and negative  PMH:  The following were reviewed and entered/updated in epic: No past medical history on file. Patient Active Problem List   Diagnosis Date Noted  . Class 2 obesity without serious comorbidity with body mass index (BMI) of 35.0 to 35.9 in adult 04/22/2017   No past surgical history on file.  Family History  Problem Relation Age of Onset  . Asthma Father   . Environmental Allergies Father   . Asthma Son     Allergies-reviewed and updated No Known Allergies  Social History   Socioeconomic History  . Marital status: Married    Spouse name: Not on file  . Number of children: Not on file  . Years of education: Not on file  . Highest education level: Not on file  Occupational History  . Not on file  Social Needs  . Financial resource strain: Not on file  . Food insecurity:    Worry: Not on file    Inability: Not on file  . Transportation needs:    Medical: Not on file    Non-medical: Not on file  Tobacco Use  . Smoking status: Never Smoker  . Smokeless tobacco: Never Used  Substance and Sexual Activity  . Alcohol use: No  . Drug use: No  . Sexual activity: Yes    Partners: Male    Birth control/protection: None  Lifestyle  . Physical activity:    Days per week: Not on file    Minutes per session: Not on file  . Stress: Not on file  Relationships  . Social connections:    Talks on phone: Not on file    Gets together: Not on file    Attends religious service: Not on file    Active member of club or organization: Not on file    Attends meetings of clubs or organizations: Not on file    Relationship status: Not on file  Other Topics Concern  . Not on file    Social History Narrative   Lives with husband and 4 kids (ages 348-16)     Objective:  Physical Exam: BP 126/84   Pulse 85   Temp 98.7 F (37.1 C) (Oral)   Ht 5' 4.5" (1.638 m)   Wt 213 lb (96.6 kg)   LMP 03/11/2018 (Approximate)   SpO2 96%   BMI 36.00 kg/m   Gen: NAD, resting comfortably CV: RRR with no murmurs appreciated Pulm: NWOB, CTAB with no crackles, wheezes, or rhonchi GI: Normal bowel sounds present. Soft, Nontender, Nondistended. MSK: no edema, cyanosis, or clubbing noted Skin: warm, dry Pelvic exam: External genitalia notable for a female circumcision changes not obscuring introitus, no clitoris. Normal vagina. Cervix with small nonbleeding polyp at 12 o'clock Neuro: grossly normal, moves all extremities Psych: Normal affect and thought content   Assessment/Plan:  Class 2 obesity without serious comorbidity with body mass index (BMI) of 35.0 to 35.9 in adult Discussed healthy lifestyle with exercise. Check LDL direct with yearly screening bloodwork today.   Healthcare maintenance - Mammogram ordered for routine screening - Referral to GI for routine screening colonoscopy - Tdap given today  - pap collected today  Leland Her, DO PGY-3, [facility] 04/01/2018 2:34 PM

## 2018-04-02 ENCOUNTER — Encounter: Payer: Self-pay | Admitting: Gastroenterology

## 2018-04-02 LAB — CBC
Hematocrit: 38.9 % (ref 34.0–46.6)
Hemoglobin: 12.8 g/dL (ref 11.1–15.9)
MCH: 26.9 pg (ref 26.6–33.0)
MCHC: 32.9 g/dL (ref 31.5–35.7)
MCV: 82 fL (ref 79–97)
Platelets: 299 10*3/uL (ref 150–450)
RBC: 4.75 x10E6/uL (ref 3.77–5.28)
RDW: 14.4 % (ref 12.3–15.4)
WBC: 5.8 10*3/uL (ref 3.4–10.8)

## 2018-04-02 LAB — BASIC METABOLIC PANEL
BUN / CREAT RATIO: 18 (ref 9–23)
BUN: 13 mg/dL (ref 6–24)
CHLORIDE: 101 mmol/L (ref 96–106)
CO2: 24 mmol/L (ref 20–29)
CREATININE: 0.73 mg/dL (ref 0.57–1.00)
Calcium: 9.4 mg/dL (ref 8.7–10.2)
GFR calc Af Amer: 111 mL/min/{1.73_m2} (ref >59)
GFR calc non Af Amer: 96 mL/min/{1.73_m2} (ref >59)
GLUCOSE: 89 mg/dL (ref 65–99)
Potassium: 4.3 mmol/L (ref 3.5–5.2)
SODIUM: 142 mmol/L (ref 134–144)

## 2018-04-02 LAB — LDL CHOLESTEROL, DIRECT: LDL Direct: 115 mg/dL — ABNORMAL HIGH (ref 0–99)

## 2018-04-03 ENCOUNTER — Encounter: Payer: Self-pay | Admitting: Family Medicine

## 2018-04-03 LAB — CYTOLOGY - PAP
Diagnosis: NEGATIVE
HPV: NOT DETECTED

## 2018-06-03 ENCOUNTER — Telehealth: Payer: Self-pay

## 2018-06-03 NOTE — Telephone Encounter (Signed)
Patient No Showed for PV. Patient was called but I was unable to leave a message due to mail box being full. If we do not hear from the patient by 5:00 Pm today a no show letter will be mailed and procedure will be cancelled.   Karina Dane, LPN ( PV )

## 2018-06-04 ENCOUNTER — Encounter: Payer: Self-pay | Admitting: Family Medicine

## 2018-06-13 ENCOUNTER — Encounter: Payer: BLUE CROSS/BLUE SHIELD | Admitting: Gastroenterology

## 2019-11-05 ENCOUNTER — Other Ambulatory Visit: Payer: Self-pay | Admitting: Family Medicine

## 2019-11-05 DIAGNOSIS — Z1231 Encounter for screening mammogram for malignant neoplasm of breast: Secondary | ICD-10-CM

## 2019-12-11 ENCOUNTER — Ambulatory Visit: Payer: BLUE CROSS/BLUE SHIELD

## 2020-02-21 ENCOUNTER — Encounter (HOSPITAL_COMMUNITY): Payer: Self-pay | Admitting: *Deleted

## 2020-02-21 ENCOUNTER — Emergency Department (HOSPITAL_COMMUNITY)
Admission: EM | Admit: 2020-02-21 | Discharge: 2020-02-22 | Discharge status: Against Medical Advice | Payer: BLUE CROSS/BLUE SHIELD | Attending: Emergency Medicine | Admitting: Emergency Medicine

## 2020-02-21 ENCOUNTER — Other Ambulatory Visit: Payer: Self-pay

## 2020-02-21 DIAGNOSIS — Z5321 Procedure and treatment not carried out due to patient leaving prior to being seen by health care provider: Secondary | ICD-10-CM | POA: Insufficient documentation

## 2020-02-21 DIAGNOSIS — R05 Cough: Secondary | ICD-10-CM | POA: Insufficient documentation

## 2020-02-21 NOTE — ED Triage Notes (Signed)
The pt has a cold and it makes it hard for her to breathe since this am  Pt sniffing intermittently

## 2020-02-22 NOTE — ED Notes (Signed)
Pt states the wait is too long and she is leaving 

## 2020-07-30 ENCOUNTER — Ambulatory Visit
Admission: EM | Admit: 2020-07-30 | Discharge: 2020-07-30 | Discharge status: Absent without leave | Payer: BLUE CROSS/BLUE SHIELD

## 2020-07-30 ENCOUNTER — Other Ambulatory Visit: Payer: Self-pay

## 2020-07-30 ENCOUNTER — Emergency Department (HOSPITAL_COMMUNITY)
Admission: EM | Admit: 2020-07-30 | Discharge: 2020-07-31 | Disposition: A | Discharge status: Home / Self Care | Payer: BLUE CROSS/BLUE SHIELD | Attending: Emergency Medicine | Admitting: Emergency Medicine

## 2020-07-30 DIAGNOSIS — Z5321 Procedure and treatment not carried out due to patient leaving prior to being seen by health care provider: Secondary | ICD-10-CM | POA: Insufficient documentation

## 2020-07-30 DIAGNOSIS — R202 Paresthesia of skin: Secondary | ICD-10-CM | POA: Insufficient documentation

## 2020-07-30 NOTE — ED Triage Notes (Signed)
Patient complains of intermittent numbness that started one week ago. No focal deficits or weaknesses. No difficulty with ambulation. Patient alert, oriented, and in no apparent distress at this time.

## 2020-07-31 NOTE — ED Notes (Signed)
Patient stated she was leaving and handed tech labels.

## 2020-09-21 ENCOUNTER — Ambulatory Visit: Payer: BLUE CROSS/BLUE SHIELD

## 2020-09-23 ENCOUNTER — Emergency Department (HOSPITAL_COMMUNITY)
Admission: EM | Admit: 2020-09-23 | Discharge: 2020-09-24 | Disposition: A | Discharge status: Home / Self Care | Payer: Self-pay | Attending: Emergency Medicine | Admitting: Emergency Medicine

## 2020-09-23 DIAGNOSIS — R0989 Other specified symptoms and signs involving the circulatory and respiratory systems: Secondary | ICD-10-CM | POA: Insufficient documentation

## 2020-09-23 DIAGNOSIS — R059 Cough, unspecified: Secondary | ICD-10-CM | POA: Insufficient documentation

## 2020-09-23 DIAGNOSIS — Z20822 Contact with and (suspected) exposure to covid-19: Secondary | ICD-10-CM | POA: Insufficient documentation

## 2020-09-23 DIAGNOSIS — Z5321 Procedure and treatment not carried out due to patient leaving prior to being seen by health care provider: Secondary | ICD-10-CM | POA: Insufficient documentation

## 2020-09-23 DIAGNOSIS — R6883 Chills (without fever): Secondary | ICD-10-CM | POA: Insufficient documentation

## 2020-09-23 DIAGNOSIS — R070 Pain in throat: Secondary | ICD-10-CM | POA: Insufficient documentation

## 2020-09-23 NOTE — ED Triage Notes (Signed)
Pt states that she has had a cough and runny nose since 5pm, denies fevers, c/o of chills and sore throat.

## 2020-09-24 LAB — RESP PANEL BY RT-PCR (FLU A&B, COVID) ARPGX2
Influenza A by PCR: NEGATIVE
Influenza B by PCR: NEGATIVE
SARS Coronavirus 2 by RT PCR: NEGATIVE

## 2020-09-24 LAB — GROUP A STREP BY PCR: Group A Strep by PCR: NOT DETECTED

## 2020-09-24 NOTE — ED Notes (Signed)
Pt stated, "im leaving, im too tired." Pt has left ED lobby.

## 2020-12-06 ENCOUNTER — Other Ambulatory Visit: Payer: Self-pay | Admitting: Family Medicine

## 2020-12-06 DIAGNOSIS — Z1231 Encounter for screening mammogram for malignant neoplasm of breast: Secondary | ICD-10-CM

## 2021-01-26 ENCOUNTER — Ambulatory Visit: Payer: Self-pay

## 2021-08-23 ENCOUNTER — Other Ambulatory Visit: Payer: Self-pay

## 2021-08-23 ENCOUNTER — Encounter (HOSPITAL_COMMUNITY): Payer: Self-pay

## 2021-08-23 ENCOUNTER — Ambulatory Visit (HOSPITAL_COMMUNITY)
Admission: EM | Admit: 2021-08-23 | Discharge: 2021-08-23 | Disposition: A | Discharge status: Home / Self Care | Payer: BLUE CROSS/BLUE SHIELD | Attending: Emergency Medicine | Admitting: Emergency Medicine

## 2021-08-23 DIAGNOSIS — M549 Dorsalgia, unspecified: Secondary | ICD-10-CM | POA: Insufficient documentation

## 2021-08-23 DIAGNOSIS — Z20822 Contact with and (suspected) exposure to covid-19: Secondary | ICD-10-CM | POA: Insufficient documentation

## 2021-08-23 DIAGNOSIS — H9201 Otalgia, right ear: Secondary | ICD-10-CM | POA: Diagnosis not present

## 2021-08-23 DIAGNOSIS — Z733 Stress, not elsewhere classified: Secondary | ICD-10-CM | POA: Insufficient documentation

## 2021-08-23 DIAGNOSIS — J069 Acute upper respiratory infection, unspecified: Secondary | ICD-10-CM | POA: Insufficient documentation

## 2021-08-23 DIAGNOSIS — T148XXA Other injury of unspecified body region, initial encounter: Secondary | ICD-10-CM | POA: Diagnosis not present

## 2021-08-23 DIAGNOSIS — R052 Subacute cough: Secondary | ICD-10-CM | POA: Diagnosis present

## 2021-08-23 MED ORDER — CYCLOBENZAPRINE HCL 5 MG PO TABS: 5.0000 mg | ORAL_TABLET | Freq: Three times a day (TID) | ORAL | 0 refills | Status: AC | PRN

## 2021-08-23 MED ORDER — FLUTICASONE PROPIONATE 50 MCG/ACT NA SUSP: 2.0000 | Freq: Every day | NASAL | 0 refills | Status: AC

## 2021-08-23 NOTE — ED Provider Notes (Signed)
[facility]    CSN: [id number] Arrival date & time: 08/23/21  1552      History   Chief Complaint Chief Complaint  Patient presents with   Cough   Generalized Body Aches    HPI Karina Gardner is a 55 y.o. female.  Patient reports 2 weeks of cough, nasal congestion, headache.  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  Has not tested herself since she began feeling ill.  Reports right ear pain.  Denies fever but reports chills.  For the last 3 days she also reports right back pain that she frequently gets when she is under increased stress, which she reports she is under increased stress right now.  She manages the pain with Tylenol or ibuprofen.  Denies injury.   Cough Associated symptoms: chills, ear pain, headaches, rhinorrhea and shortness of breath   Associated symptoms: no fever, no sore throat and no wheezing    History reviewed. No pertinent past medical history.  Patient Active Problem List   Diagnosis Date Noted   Healthcare maintenance 04/01/2018   Class 2 obesity without serious comorbidity with body mass index (BMI) of 35.0 to 35.9 in adult 04/22/2017    History reviewed. No pertinent surgical history.  OB History   No obstetric history on file.      Home Medications    Prior to Admission medications   Medication Sig Start Date End Date Taking? Authorizing Provider  cyclobenzaprine (FLEXERIL) 5 MG tablet Take 1 tablet (5 mg total) by mouth 3 (three) times daily as needed for muscle spasms. 08/23/21  Yes Cathlyn Parsons, NP  fluticasone (FLONASE) 50 MCG/ACT nasal spray Place 2 sprays into both nostrils daily. 08/23/21  Yes Cathlyn Parsons, NP  ibuprofen (ADVIL,MOTRIN) 600 MG tablet Take 1 tablet (600 mg total) by mouth every 6 (six) hours as needed for mild pain or moderate pain. 05/07/17   Leland Her, DO    Family History Family History  Problem Relation Age of Onset   Asthma Father     Environmental Allergies Father    Asthma Son     Social History Social History   Tobacco Use   Smoking status: Never   Smokeless tobacco: Never  Substance Use Topics   Alcohol use: No   Drug use: No     Allergies   Patient has no known allergies.   Review of Systems Review of Systems  Constitutional:  Positive for chills. Negative for fever.  HENT:  Positive for congestion, ear pain, postnasal drip and rhinorrhea. Negative for sore throat.   Respiratory:  Positive for cough and shortness of breath. Negative for wheezing.   Musculoskeletal:  Positive for back pain.  Neurological:  Positive for headaches.    Physical Exam Triage Vital Signs ED Triage Vitals [08/23/21 1649]  Enc Vitals Group     BP (!) 157/89     Pulse Rate 89     Resp 18     Temp 99.4 F (37.4 C)     Temp Source Oral     SpO2 96 %     Weight      Height      Head Circumference      Peak Flow      Pain Score 7     Pain Loc      Pain Edu?      Excl. in GC?  No data found.  Updated Vital Signs BP (!) 157/89 (BP Location: Right Arm)   Pulse 89   Temp 99.4 F (37.4 C) (Oral)   Resp 18   LMP 03/11/2018 (Approximate)   SpO2 96%   Visual Acuity Right Eye Distance:   Left Eye Distance:   Bilateral Distance:    Right Eye Near:   Left Eye Near:    Bilateral Near:     Physical Exam Constitutional:      General: She is not in acute distress.    Appearance: Normal appearance. She is not ill-appearing.  HENT:     Right Ear: Tympanic membrane, ear canal and external ear normal.     Left Ear: Tympanic membrane, ear canal and external ear normal.     Nose: Congestion present.     Mouth/Throat:     Mouth: Mucous membranes are moist.     Pharynx: Oropharynx is clear.  Cardiovascular:     Rate and Rhythm: Normal rate and regular rhythm.  Pulmonary:     Effort: Pulmonary effort is normal.     Breath sounds: Normal breath sounds.  Musculoskeletal:     Thoracic back: Tenderness  present. No deformity or bony tenderness.       Back:  Neurological:     Mental Status: She is alert.     UC Treatments / Results  Labs (all labs ordered are listed, but only abnormal results are displayed) Labs Reviewed  SARS CORONAVIRUS 2 (TAT 6-24 HRS)    EKG   Radiology No results found.  Procedures Procedures (including critical care time)  Medications Ordered in UC Medications - No data to display  Initial Impression / Assessment and Plan / UC Course  I have reviewed the triage vital signs and the nursing notes.  Pertinent labs & imaging results that were available during my care of the patient were reviewed by me and considered in my medical decision making (see chart for details).    Will test for covid. Reviewed supportive care measures. I suspect ear pain is related to nasal congestion, no evidence for AOM. As for back pain, based on symptoms and exam, muscle strain is most likely problem. Pt to continue tylenol and/or ibuprofen and rx flexeril.   Final Clinical Impressions(s) / UC Diagnoses   Final diagnoses:  Viral upper respiratory tract infection  Muscle strain  Otalgia of right ear     Discharge Instructions      Purchase saline nasal spray and use several times a day to help drain your nasal congestion and ear pressure. Use Mucinex DM to help with your congestion and with your cough. Saline nasal spray and Mucinex DM are available in stores without a prescription.   The prescription cyclobenzaprine is for your back pain. It may make you sleepy. You can take it up to 3 times a day if you need but some people take it only at night since it makes them sleepy.   The flonase nasal spray should be used every day until you are feeling better (use it at a different time than the saline spray).   If your COVID test is positive, you will get a phone call. If the test is negative, you will not be called but you can check your results in MyChart if you have a  MyChart account.    ED Prescriptions     Medication Sig Dispense Auth. Provider   cyclobenzaprine (FLEXERIL) 5 MG tablet Take 1 tablet (5 mg total) by  mouth 3 (three) times daily as needed for muscle spasms. 21 tablet Cathlyn Parsons, NP   fluticasone (FLONASE) 50 MCG/ACT nasal spray Place 2 sprays into both nostrils daily. 16 g Cathlyn Parsons, NP      PDMP not reviewed this encounter.   Cathlyn Parsons, NP 08/23/21 [phone]

## 2021-08-23 NOTE — ED Triage Notes (Signed)
Pt c/o productive cough with white sputum, rt sided body aches, and headache x3 days.

## 2021-08-23 NOTE — Discharge Instructions (Addendum)
Purchase saline nasal spray and use several times a day to help drain your nasal congestion and ear pressure. Use Mucinex DM to help with your congestion and with your cough. Saline nasal spray and Mucinex DM are available in stores without a prescription.   The prescription cyclobenzaprine is for your back pain. It may make you sleepy. You can take it up to 3 times a day if you need but some people take it only at night since it makes them sleepy.   The flonase nasal spray should be used every day until you are feeling better (use it at a different time than the saline spray).   If your COVID test is positive, you will get a phone call. If the test is negative, you will not be called but you can check your results in MyChart if you have a MyChart account.

## 2021-08-24 LAB — SARS CORONAVIRUS 2 (TAT 6-24 HRS): SARS Coronavirus 2: NEGATIVE

## 2023-06-25 ENCOUNTER — Emergency Department (HOSPITAL_COMMUNITY): Payer: BLUE CROSS/BLUE SHIELD

## 2023-06-25 ENCOUNTER — Other Ambulatory Visit: Payer: Self-pay

## 2023-06-25 ENCOUNTER — Emergency Department (HOSPITAL_COMMUNITY)
Admission: EM | Admit: 2023-06-25 | Discharge: 2023-06-25 | Disposition: A | Discharge status: Home / Self Care | Payer: BLUE CROSS/BLUE SHIELD | Attending: Emergency Medicine | Admitting: Emergency Medicine

## 2023-06-25 DIAGNOSIS — R1084 Generalized abdominal pain: Secondary | ICD-10-CM | POA: Diagnosis present

## 2023-06-25 DIAGNOSIS — R112 Nausea with vomiting, unspecified: Secondary | ICD-10-CM | POA: Diagnosis not present

## 2023-06-25 LAB — COMPREHENSIVE METABOLIC PANEL
ALT: 12 U/L (ref 0–44)
AST: 15 U/L (ref 15–41)
Albumin: 3.6 g/dL (ref 3.5–5.0)
Alkaline Phosphatase: 83 U/L (ref 38–126)
Anion gap: 12 (ref 5–15)
BUN: 12 mg/dL (ref 6–20)
CO2: 24 mmol/L (ref 22–32)
Calcium: 9 mg/dL (ref 8.9–10.3)
Chloride: 104 mmol/L (ref 98–111)
Creatinine, Ser: 0.97 mg/dL (ref 0.44–1.00)
GFR, Estimated: >60 mL/min (ref >60)
Glucose, Bld: 107 mg/dL — ABNORMAL HIGH (ref 70–99)
Potassium: 3.7 mmol/L (ref 3.5–5.1)
Sodium: 140 mmol/L (ref 135–145)
Total Bilirubin: 0.7 mg/dL (ref 0.3–1.2)
Total Protein: 7.1 g/dL (ref 6.5–8.1)

## 2023-06-25 LAB — URINALYSIS, ROUTINE W REFLEX MICROSCOPIC
Bacteria, UA: NONE SEEN
Bilirubin Urine: NEGATIVE
Glucose, UA: NEGATIVE mg/dL
Hgb urine dipstick: NEGATIVE
Ketones, ur: NEGATIVE mg/dL
Leukocytes,Ua: NEGATIVE
Nitrite: NEGATIVE
Protein, ur: 30 mg/dL — AB
Specific Gravity, Urine: 1.025 (ref 1.005–1.030)
pH: 5 (ref 5.0–8.0)

## 2023-06-25 LAB — CBC
HCT: 39.3 % (ref 36.0–46.0)
Hemoglobin: 12.6 g/dL (ref 12.0–15.0)
MCH: 26.9 pg (ref 26.0–34.0)
MCHC: 32.1 g/dL (ref 30.0–36.0)
MCV: 83.8 fL (ref 80.0–100.0)
Platelets: 252 10*3/uL (ref 150–400)
RBC: 4.69 MIL/uL (ref 3.87–5.11)
RDW: 13 % (ref 11.5–15.5)
WBC: 6.8 10*3/uL (ref 4.0–10.5)
nRBC: 0 % (ref 0.0–0.2)

## 2023-06-25 LAB — LIPASE, BLOOD: Lipase: 40 U/L (ref 11–51)

## 2023-06-25 MED ORDER — ACETAMINOPHEN 325 MG PO TABS: 650.0000 mg | ORAL_TABLET | Freq: Four times a day (QID) | ORAL | Status: DC | PRN

## 2023-06-25 MED ORDER — LIDOCAINE VISCOUS HCL 2 % MT SOLN
15.0000 mL | Freq: Once | OROMUCOSAL | Status: AC
  Administered 2023-06-25: 15 mL via ORAL
  Filled 2023-06-25: qty 15

## 2023-06-25 MED ORDER — ONDANSETRON 4 MG PO TBDP
4.0000 mg | ORAL_TABLET | Freq: Three times a day (TID) | ORAL | 0 refills | Status: AC | PRN
Start: 2023-06-25 — End: ?

## 2023-06-25 MED ORDER — ACETAMINOPHEN 325 MG PO TABS
650.0000 mg | ORAL_TABLET | Freq: Four times a day (QID) | ORAL | Status: DC | PRN
  Administered 2023-06-25: 650 mg via ORAL
  Filled 2023-06-25: qty 2

## 2023-06-25 MED ORDER — ALUM & MAG HYDROXIDE-SIMETH 200-200-20 MG/5ML PO SUSP
30.0000 mL | Freq: Once | ORAL | Status: AC
Start: 2023-06-25 — End: 2023-06-25
  Administered 2023-06-25: 30 mL via ORAL
  Filled 2023-06-25: qty 30

## 2023-06-25 MED ORDER — IOHEXOL 350 MG/ML SOLN
75.0000 mL | Freq: Once | INTRAVENOUS | Status: AC | PRN
  Administered 2023-06-25: 75 mL via INTRAVENOUS

## 2023-06-25 MED ORDER — LACTATED RINGERS IV BOLUS
1000.0000 mL | Freq: Once | INTRAVENOUS | Status: AC
  Administered 2023-06-25: 1000 mL via INTRAVENOUS

## 2023-06-25 MED ORDER — ONDANSETRON HCL 4 MG/2ML IJ SOLN
4.0000 mg | Freq: Once | INTRAMUSCULAR | Status: AC
Start: 2023-06-25 — End: 2023-06-25
  Administered 2023-06-25: 4 mg via INTRAVENOUS
  Filled 2023-06-25: qty 2

## 2023-06-25 NOTE — ED Triage Notes (Signed)
Patient reports mid abdominal pain this morning with emesis , no fever or diarrhea .

## 2023-06-25 NOTE — Discharge Instructions (Addendum)
Follow-up with primary care provider regarding symptom management and routine medical maintenance.   I sent Zofran to your pharmacy at [facility] on [address] to be taken as needed for nausea.  You may take Maalox which is over-the-counter as needed for abdominal discomfort.   Discussed return to emergency department precautions to include but not limited to worsening abdominal pain, blood in the stool, intractable vomiting

## 2023-06-25 NOTE — ED Notes (Signed)
Patient transported to CT 

## 2023-06-25 NOTE — ED Provider Notes (Signed)
[place] EMERGENCY DEPARTMENT AT [facility] Provider Note   CSN: [id number] Arrival date & time: 06/25/23  0543     History  Chief Complaint  Patient presents with   Abdominal Pain    Karina Gardner is a 57 y.o. female with no significant past medical history presents emergency department for evaluation of abdominal pain and 3 episodes of vomiting that started at 0500 this morning.  She reports that she had last eaten yesterday at 1500 and last bowel movement was on 9/28.  She denies constipation, diarrhea, hematochezia, fever, shortness of breath, chest pain.   Abdominal Pain Associated symptoms: nausea and vomiting   Associated symptoms: no chest pain, no constipation, no cough, no diarrhea, no fever and no shortness of breath        Home Medications Prior to Admission medications   Medication Sig Start Date End Date Taking? Authorizing Provider  cyclobenzaprine (FLEXERIL) 5 MG tablet Take 1 tablet (5 mg total) by mouth 3 (three) times daily as needed for muscle spasms. 08/23/21   Cathlyn Parsons, NP  fluticasone (FLONASE) 50 MCG/ACT nasal spray Place 2 sprays into both nostrils daily. 08/23/21   Cathlyn Parsons, NP  ibuprofen (ADVIL,MOTRIN) 600 MG tablet Take 1 tablet (600 mg total) by mouth every 6 (six) hours as needed for mild pain or moderate pain. 05/07/17   Leland Her, DO      Allergies    Patient has no known allergies.    Review of Systems   Review of Systems  Constitutional:  Negative for fever.  Respiratory:  Negative for cough, chest tightness and shortness of breath.   Cardiovascular:  Negative for chest pain.  Gastrointestinal:  Positive for abdominal pain, nausea and vomiting. Negative for abdominal distention, blood in stool, constipation and diarrhea.  Genitourinary: Negative.     Physical Exam Updated Vital Signs BP 130/83   Pulse 71   Temp 98.1 F (36.7 C) (Oral)   Resp 17   LMP 03/11/2018 (Approximate)   SpO2 100%  Physical  Exam Vitals and nursing note reviewed.  Constitutional:      General: She is not in acute distress.    Appearance: Normal appearance. She is not ill-appearing.  HENT:     Head: Normocephalic and atraumatic.  Eyes:     Conjunctiva/sclera: Conjunctivae normal.  Cardiovascular:     Rate and Rhythm: Normal rate.  Pulmonary:     Effort: Pulmonary effort is normal. No respiratory distress.     Breath sounds: Normal breath sounds.  Abdominal:     General: There is no distension.     Palpations: Abdomen is soft.     Tenderness: There is abdominal tenderness (Diffuse).  Musculoskeletal:     Cervical back: Neck supple. No rigidity.  Lymphadenopathy:     Cervical: No cervical adenopathy.  Skin:    General: Skin is warm.     Capillary Refill: Capillary refill takes less than 2 seconds.     Coloration: Skin is not jaundiced or pale.  Neurological:     Mental Status: She is alert. Mental status is at baseline.     ED Results / Procedures / Treatments   Labs (all labs ordered are listed, but only abnormal results are displayed) Labs Reviewed  COMPREHENSIVE METABOLIC PANEL - Abnormal; Notable for the following components:      Result Value   Glucose, Bld 107 (*)    All other components within normal limits  URINALYSIS, ROUTINE W REFLEX  MICROSCOPIC - Abnormal; Notable for the following components:   APPearance HAZY (*)    Protein, ur 30 (*)    All other components within normal limits  LIPASE, BLOOD  CBC    EKG None  Radiology CT ABDOMEN PELVIS W CONTRAST  Result Date: 06/25/2023 CLINICAL DATA:  Acute onset mid abdominal pain associated with emesis EXAM: CT ABDOMEN AND PELVIS WITH CONTRAST TECHNIQUE: Multidetector CT imaging of the abdomen and pelvis was performed using the standard protocol following bolus administration of intravenous contrast. RADIATION DOSE REDUCTION: This exam was performed according to the departmental dose-optimization program which includes automated  exposure control, adjustment of the mA and/or kV according to patient size and/or use of iterative reconstruction technique. CONTRAST:  75mL OMNIPAQUE IOHEXOL 350 MG/ML SOLN COMPARISON:  None Available. FINDINGS: Lower chest: No focal consolidation or pulmonary nodule in the lung bases. No pleural effusion or pneumothorax demonstrated. Partially imaged heart size is normal. Hepatobiliary: No focal hepatic lesions. No intra or extrahepatic biliary ductal dilation. Normal gallbladder. Pancreas: No focal lesions or main ductal dilation. Spleen: Normal in size without focal abnormality. Adrenals/Urinary Tract: No adrenal nodules. No suspicious renal mass, calculi or hydronephrosis. No focal bladder wall thickening. Stomach/Bowel: Normal appearance of the stomach. No evidence of bowel wall thickening, distention, or inflammatory changes. Normal appendix. Vascular/Lymphatic: Aortic atherosclerosis. Nonspecific asymmetric enhancement of the left ovarian vein. No enlarged abdominal or pelvic lymph nodes. Reproductive: No adnexal masses. Other: No free fluid, fluid collection, or free air. Musculoskeletal: No acute or abnormal lytic or blastic osseous lesions. IMPRESSION: 1. No acute abnormality in the abdomen or pelvis. 2.  Aortic Atherosclerosis (ICD10-I70.0). Electronically Signed   By: Agustin Cree M.D.   On: 06/25/2023 09:46    Procedures Procedures    Medications Ordered in ED Medications  acetaminophen (TYLENOL) tablet 650 mg (650 mg Oral Given 06/25/23 0921)  lactated ringers bolus 1,000 mL (0 mLs Intravenous Stopped 06/25/23 1015)  ondansetron (ZOFRAN) injection 4 mg (4 mg Intravenous Given 06/25/23 0840)  alum & mag hydroxide-simeth (MAALOX/MYLANTA) 200-200-20 MG/5ML suspension 30 mL (30 mLs Oral Given 06/25/23 0844)    And  lidocaine (XYLOCAINE) 2 % viscous mouth solution 15 mL (15 mLs Oral Given 06/25/23 0844)  iohexol (OMNIPAQUE) 350 MG/ML injection 75 mL (75 mLs Intravenous Contrast Given 06/25/23 0906)     ED Course/ Medical Decision Making/ A&P                                 Medical Decision Making Amount and/or Complexity of Data Reviewed Labs: ordered.     Patient presents to the ED for concern of abdominal pain and 3 episodes of vomiting that started at 0500 this morning, this involves an extensive number of treatment options, and is a complaint that carries with it a high risk of complications and morbidity.  The differential diagnosis includes gastroenteritis, acute intra-abdominal pathology   Co morbidities that complicate the patient evaluation  None   Additional history obtained:  Additional history obtained from  Outside Medical Records  External records from outside source obtained and reviewed   Lab Tests:  I Ordered, and personally interpreted labs.  The pertinent results include: No leukocytosis, electrolyte abnormalities, nor anemia. Negative lipase UA negative for acute infection.   Imaging Studies ordered:  I ordered imaging studies including CT abdomen pelvis with contrast I independently visualized and interpreted imaging which showed no acute intra-abdominal pathology I agree with  the radiologist interpretation   Cardiac Monitoring:  Due to no complaints of chest pain, shortness of breath, tachycardia I do not feel that EKG is needed for treatment or disposition plan.   Medicines ordered and prescription drug management:  I ordered medication including Zofran for nausea, Tylenol for mild headache, GI cocktail and 1L LR for abdominal discomfort  Reevaluation of the patient after these medicines showed that the patient improved I have reviewed the patients home medicines and have made adjustments as needed   Problem List / ED Course:  Diffuse abdominal pain Vomiting   Reevaluation:  After the interventions noted above, I reevaluated the patient and found that they have :improved    Dispostion:  Upon evaluation, patient is resting  comfortably in bed.  She is not ill-appearing.  Vital signs not significant for tachycardia, tachypnea, fever. See HPI.  Lab work is not significant for leukocytosis, anemia, electrolyte abnormality.  Lipase negative.  UA negative for acute infection.  Will provide GI cocktail, Zofran, Tylenol, 1 L LR to see if improvement of symptoms.  As patient has no CT abdomen pelvis in the past upon chart review, will obtain due to new onset of symptoms, diffuse abdominal pain and vomiting to r/o intraabdominal pathology to explain symptoms.  Emergent differential diagnoses that were considered but I have low suspicion for include acute cholecystitis, bowel obstruction, mesenteric ischemia.  CT abdomen pelvis negative for acute pathology.  After consideration of the diagnostic results and the patients response to treatment, I feel that the patent would benefit from outpatient management with maalox as needed for abdominal discomfort.  Patient reports alleviation of symptoms following GI cocktail.  Will prescribe Zofran as needed for nausea. Discussed findings with patient, treatment, and disposition plan.  Patient expresses understanding and agrees with plan.  All questions were answered.  Patient is stable for discharge. Patient is to follow-up with primary care provider regarding symptom management. Discussed return to emergency department precautions with patient to include but not limited to worsening abdominal pain, melena, hematochezia, intractable vomiting which patient expresses understanding.          Final Clinical Impression(s) / ED Diagnoses Final diagnoses:  Generalized abdominal pain    Rx / DC Orders ED Discharge Orders     None         Judithann Sheen, PA 06/25/23 1024    Tegeler, Canary Brim, MD 06/25/23 1550
